# Patient Record
Sex: Female | Born: 1995 | Race: White | Hispanic: No | Marital: Single
Health system: Southern US, Community
[De-identification: ages and names within clinical notes are randomized; demographics above are authoritative.]

## PROBLEM LIST (undated history)

## (undated) DIAGNOSIS — S43101A Unspecified dislocation of right acromioclavicular joint, initial encounter: Secondary | ICD-10-CM

## (undated) DIAGNOSIS — J45909 Unspecified asthma, uncomplicated: Secondary | ICD-10-CM

## (undated) DIAGNOSIS — I639 Cerebral infarction, unspecified: Secondary | ICD-10-CM

## (undated) DIAGNOSIS — M797 Fibromyalgia: Secondary | ICD-10-CM

## (undated) DIAGNOSIS — D649 Anemia, unspecified: Secondary | ICD-10-CM

## (undated) DIAGNOSIS — Z8719 Personal history of other diseases of the digestive system: Secondary | ICD-10-CM

## (undated) DIAGNOSIS — N72 Inflammatory disease of cervix uteri: Secondary | ICD-10-CM

## (undated) HISTORY — PX: WISDOM TOOTH EXTRACTION: SHX21

## (undated) HISTORY — DX: Unspecified asthma, uncomplicated: J45.909

## (undated) HISTORY — PX: NO PAST SURGERIES: SHX2092

## (undated) HISTORY — DX: Unspecified dislocation of right acromioclavicular joint, initial encounter: S43.101A

## (undated) HISTORY — DX: Personal history of other diseases of the digestive system: Z87.19

## (undated) HISTORY — PX: LOOP RECORDER IMPLANT: SHX5954

## (undated) HISTORY — DX: Fibromyalgia: M79.7

## (undated) HISTORY — DX: Inflammatory disease of cervix uteri: N72

---
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
## 2017-04-09 ENCOUNTER — Telehealth: Payer: Self-pay

## 2017-04-09 NOTE — Telephone Encounter (Signed)
Received call from patient stating she was seen at [facility] this am with complaints of bleeding. Patient reports taking four pregnancy test at home and all came back positive for pregnancy. Patient reported they stated last night her test was negative for pregnancy at the hospital. Patient was advised to follow up with our office in two days. I have advised patient to report to MAU admission for a work up for bleeding in pregnancy and that they can draw a bhcg to determine if she is pregnant. Patient reported not getting any documentation on what labs were drawn last night. I am not able to see anything in care everywhere. Patient verbalizes understanding at this time.

## 2017-05-09 ENCOUNTER — Encounter: Payer: Self-pay | Admitting: Obstetrics and Gynecology

## 2017-05-09 DIAGNOSIS — Z3480 Encounter for supervision of other normal pregnancy, unspecified trimester: Secondary | ICD-10-CM | POA: Insufficient documentation

## 2017-05-20 ENCOUNTER — Encounter: Payer: Self-pay | Admitting: Obstetrics and Gynecology

## 2017-05-20 ENCOUNTER — Ambulatory Visit (INDEPENDENT_AMBULATORY_CARE_PROVIDER_SITE_OTHER): Payer: Medicaid Other | Admitting: *Deleted

## 2017-05-20 VITALS — BP 102/59 | Wt 119.0 lb

## 2017-05-20 DIAGNOSIS — Z3201 Encounter for pregnancy test, result positive: Secondary | ICD-10-CM

## 2017-05-20 NOTE — Progress Notes (Signed)
PRENATAL INTAKE SUMMARY  [name] presents today New OB Nurse Interview.  OB History    Gravida  3   Para  1   Term  1   Preterm  0   AB  1   Living  1     SAB  1   TAB  0   Ectopic  0   Multiple  0   Live Births  1          I have reviewed the patient's medical, obstetrical, social, and family histories, medications, and available lab results.  Pt states she had a miscarriage 04/09/17 and was [facility]. She has not had a cycle since then but has had a new positive Pregnancy Test.  She denies any bleeding or abnormal vaginal discharge.   SUBJECTIVE She has no unusual complaints  OBJECTIVE Initial Physical Exam (New OB)  GENERAL APPEARANCE: alert, well appearing, in no apparent distress, oriented to person, place and time   ASSESSMENT Positive Pregnancy - See US documentation below  PLAN New OB visit with MD in two weeks - Need to confirm viability before seen by MD Genetic/Infection screening complete.    DATING AND VIABILITY SONOGRAM   [name] is a 22 y.o. year old 713P1011 with LMP Patient's last menstrual period was 04/09/2017. which would correlate to  7637w6d weeks gestation.  She has irregular menstrual cycles.   She is here today for a confirmatory initial sonogram.    GESTATION: Unable to determine   FETAL ACTIVITY: Heart rate - Not seen The fetus is Not seen.   Gestational criteria: Estimated Date of Delivery: 01/14/18 by LMP now at 2137w6d  Previous Scans:0 WORKING EDD( LMP):  01/14/18    TECHNICIAN COMMENTS:  Gestational Sac and yolk sac seen on bedside US. No fetal pole seen. No FHR seen. Pt to rtn in 2 weeks for New OB visit with MD and viability scan.   Mandy Hutchinson 05/20/2017 3:06 PM

## 2017-05-21 NOTE — Progress Notes (Signed)
I have reviewed the chart and agree with nursing staff's documentation of this patient's encounter.  [place] Bingharlie Avari Nevares, MD 05/21/2017 11:27 AM

## 2017-06-02 ENCOUNTER — Ambulatory Visit (INDEPENDENT_AMBULATORY_CARE_PROVIDER_SITE_OTHER): Payer: Medicaid Other | Admitting: Obstetrics and Gynecology

## 2017-06-02 ENCOUNTER — Encounter: Payer: Self-pay | Admitting: Obstetrics and Gynecology

## 2017-06-02 ENCOUNTER — Other Ambulatory Visit (HOSPITAL_COMMUNITY)
Admission: RE | Admit: 2017-06-02 | Discharge: 2017-06-02 | Disposition: A | Discharge status: Home / Self Care | Payer: Medicaid Other | Source: Ambulatory Visit | Attending: Obstetrics and Gynecology | Admitting: Obstetrics and Gynecology

## 2017-06-02 VITALS — BP 103/68 | HR 61 | Wt 116.0 lb

## 2017-06-02 DIAGNOSIS — O219 Vomiting of pregnancy, unspecified: Secondary | ICD-10-CM | POA: Insufficient documentation

## 2017-06-02 DIAGNOSIS — Z3687 Encounter for antenatal screening for uncertain dates: Secondary | ICD-10-CM

## 2017-06-02 DIAGNOSIS — Z3480 Encounter for supervision of other normal pregnancy, unspecified trimester: Secondary | ICD-10-CM

## 2017-06-02 DIAGNOSIS — Z3481 Encounter for supervision of other normal pregnancy, first trimester: Secondary | ICD-10-CM | POA: Diagnosis not present

## 2017-06-02 MED ORDER — PROMETHAZINE HCL 25 MG/ML IJ SOLN
25.0000 mg | Freq: Once | INTRAMUSCULAR | Status: AC
  Administered 2017-06-02: 25 mg via INTRAMUSCULAR

## 2017-06-02 MED ORDER — DOXYLAMINE-PYRIDOXINE ER 20-20 MG PO TBCR: 1.0000 | EXTENDED_RELEASE_TABLET | Freq: Two times a day (BID) | ORAL | 3 refills | Status: DC

## 2017-06-02 NOTE — Progress Notes (Signed)
DATING AND VIABILITY SONOGRAM   [name] is a 22 y.o. year old 183P1011 with LMP Patient's last menstrual period was 04/09/2017. which would correlate to  6051w5d weeks gestation.  She has irregular menstrual cycles.   She is here today for a confirmatory initial sonogram.    GESTATION: SINGLETON     FETAL ACTIVITY:          Heart rate: 141bpm          The fetus is current.   GESTATIONAL AGE AND  BIOMETRICS:  Gestational criteria: Estimated Date of Delivery: 01/14/18 by LMP now at 8251w5d  Previous Scans:1  CROWN RUMP LENGTH   1.15cm       5312w2d           AVERAGE EGA(BY THIS SCAN):  7.2 weeks  WORKING EDD( LMP ):  01/14/18     TECHNICIAN COMMENTS:  SLIUP measuring 7.2 wks by CRL with FHR 141bpm  Leylah Tarnow 06/02/2017 4:24 PM

## 2017-06-02 NOTE — Progress Notes (Signed)
New OB Note  06/02/2017   Clinic: [facility]  Chief Complaint: NOB  Transfer of Care Patient: no  History of Present Illness: [name] is a 22 y.o. G3P1011 @ 7/5 weeks (EDC 11/27, based on Patient's last menstrual period was 04/09/2017.=7wk u/s).  Preg complicated by has Asthma; Supervision of other normal pregnancy, antepartum; and Nausea and vomiting of pregnancy, antepartum on their problem list.   Any events prior to today's visit: no Her periods were: qmonth, regular She was using no method when she conceived.  She has Positive signs or symptoms of nausea/vomiting of pregnancy. She has Negative signs or symptoms of miscarriage or preterm labor On any medications around the time she conceived/early pregnancy: No   ROS: A 12-point review of systems was performed and negative, except as stated in the above HPI.  OBGYN History: As per HPI. OB History  Gravida Para Term Preterm AB Living  3 1 1  0 1 1  SAB TAB Ectopic Multiple Live Births  1 0 0 0 1    # Outcome Date GA Lbr Len/2nd Weight Sex Delivery Anes PTL Lv  3 Current           2 SAB 04/09/17     SAB     1 Term 11/19/12 6543w4d 21:35 / 01:28 7 lb 10.9 oz (3.485 kg) M Vag-Spont EPI  LIV     Birth Comments: WNL    Any issues with any prior pregnancies: no Prior children are healthy, doing well, and without any problems or issues: yes History of pap smears: No  Past Medical History: Past Medical History:  Diagnosis Date  . Asthma    Rare uses rescue inhaler  . Cervicitis 2014  . Medical history non-contributory     Past Surgical History: Past Surgical History:  Procedure Laterality Date  . WISDOM TOOTH EXTRACTION      Family History:  Family History  Problem Relation Age of Onset  . Lupus Mother   . Cerebral palsy Brother        preterm delivery/mother has lupus    She denies any history of mental retardation, birth defects or genetic disorders in her or the FOB's  history  Social History:  Social History   Socioeconomic History  . Marital status: Single    Spouse name: Not on file  . Number of children: Not on file  . Years of education: Not on file  . Highest education level: Not on file  Occupational History  . Not on file  Social Needs  . Financial resource strain: Not on file  . Food insecurity:    Worry: Not on file    Inability: Not on file  . Transportation needs:    Medical: Not on file    Non-medical: Not on file  Tobacco Use  . Smoking status: Never Smoker  . Smokeless tobacco: Never Used  Substance and Sexual Activity  . Alcohol use: No  . Drug use: No  . Sexual activity: Yes    Partners: Male    Birth control/protection: None  Lifestyle  . Physical activity:    Days per week: Not on file    Minutes per session: Not on file  . Stress: Not on file  Relationships  . Social connections:    Talks on phone: Not on file    Gets together: Not on file    Attends religious service: Not on file    Active member of club or organization: Not  on file    Attends meetings of clubs or organizations: Not on file    Relationship status: Not on file  . Intimate partner violence:    Fear of current or ex partner: Not on file    Emotionally abused: Not on file    Physically abused: Not on file    Forced sexual activity: Not on file  Other Topics Concern  . Not on file  Social History Narrative  . Not on file     Allergy: Allergies  Allergen Reactions  . Amoxicillin Rash    Health Maintenance:  Mammogram Up to Date: not applicable  Current Outpatient Medications: PNV  Physical Exam:   BP 103/68   Pulse 61   Wt 116 lb (52.6 kg)   LMP 04/09/2017   BMI 22.65 kg/m  Body mass index is 22.65 kg/m. Contractions: Not present Vag. Bleeding: None. Fundal height: not applicable FHTs: 140s  General appearance: Well nourished, well developed female in no acute distress.  Neck:  Supple, normal appearance, and no  thyromegaly  Cardiovascular: S1, S2 normal, no murmur, rub or gallop, regular rate and rhythm Respiratory:  Clear to auscultation bilateral. Normal respiratory effort Abdomen: positive bowel sounds and no masses, hernias; diffusely non tender to palpation, non distended Breasts: breasts appear normal, no suspicious masses, no skin or nipple changes or axillary nodes, and normal palpation. +b/l nipple piercings. Neuro/Psych:  Normal mood and affect.  Skin:  Warm and dry.  Lymphatic:  No inguinal lymphadenopathy.   Pelvic exam: is not limited by body habitus EGBUS: within normal limits, Vagina: within normal limits and with no blood in the vault, Cervix: normal appearing cervix without discharge or lesions, closed/long/high, Uterus:  enlarged, c/w 8 week size, and Adnexa:  normal adnexa and no mass, fullness, tenderness  Laboratory: None  Imaging:  Bedside u/s with SLIUP with normal FHR and c/w LMP based on CRL  Assessment: pt doing well  Plan: 1. Supervision of other normal pregnancy, antepartum Routine care. Desires genetics and can do cffdna nv. Pt to try and void for ucx. Pt had bad n/v with last pregnancy that worked with zofran. Has never tried diclegis. bonjesta sent in and IM phenergan shot given in clinic. Pt told to let us know by end of week if bonjesta doesn't seem to be working and can try PO phenergan or zofran. D/w her re: potential issues with zofran.  - Obstetric Panel, Including HIV - Cytology - PAP - Urine Culture - Hemoglobinopathy evaluation - SMN1 Copy Number Analysis - [place] bedside; Future - Hepatitis C Antibody  Problem list reviewed and updated.  Follow up in 3 weeks.   >50% of 25 min visit spent on counseling and coordination of care.     Cornelia Copa MD Attending Center for [facility])

## 2017-06-03 ENCOUNTER — Encounter: Payer: Self-pay | Admitting: Obstetrics and Gynecology

## 2017-06-03 LAB — OBSTETRIC PANEL, INCLUDING HIV
Antibody Screen: NEGATIVE
Basophils Absolute: 0 10*3/uL (ref 0.0–0.2)
Basos: 0 %
EOS (ABSOLUTE): 0 10*3/uL (ref 0.0–0.4)
Eos: 0 %
HEP B S AG: NEGATIVE
HIV SCREEN 4TH GENERATION: NONREACTIVE
Hematocrit: 34.5 % (ref 34.0–46.6)
Hemoglobin: 12.2 g/dL (ref 11.1–15.9)
IMMATURE GRANULOCYTES: 0 %
Immature Grans (Abs): 0 10*3/uL (ref 0.0–0.1)
Lymphocytes Absolute: 2.3 10*3/uL (ref 0.7–3.1)
Lymphs: 24 %
MCH: 31.4 pg (ref 26.6–33.0)
MCHC: 35.4 g/dL (ref 31.5–35.7)
MCV: 89 fL (ref 79–97)
MONOS ABS: 0.8 10*3/uL (ref 0.1–0.9)
Monocytes: 8 %
NEUTROS ABS: 6.4 10*3/uL (ref 1.4–7.0)
NEUTROS PCT: 68 %
PLATELETS: 250 10*3/uL (ref 150–379)
RBC: 3.88 x10E6/uL (ref 3.77–5.28)
RDW: 12.5 % (ref 12.3–15.4)
RPR: NONREACTIVE
Rh Factor: POSITIVE
Rubella Antibodies, IGG: <0.9 index — ABNORMAL LOW (ref >0.99)
WBC: 9.5 10*3/uL (ref 3.4–10.8)

## 2017-06-03 LAB — URINE CULTURE

## 2017-06-03 LAB — HEPATITIS C ANTIBODY

## 2017-06-04 LAB — CYTOLOGY - PAP
Chlamydia: NEGATIVE
Diagnosis: NEGATIVE
NEISSERIA GONORRHEA: NEGATIVE
Trichomonas: NEGATIVE

## 2017-06-10 LAB — SMN1 COPY NUMBER ANALYSIS (SMA CARRIER SCREENING)

## 2017-06-10 LAB — HEMOGLOBINOPATHY EVALUATION
HGB A: 97.8 % (ref 96.4–98.8)
HGB C: 0 %
HGB S: 0 %
HGB VARIANT: 0 %
Hemoglobin A2 Quantitation: 2.2 % (ref 1.8–3.2)
Hemoglobin F Quantitation: 0 % (ref 0.0–2.0)

## 2017-06-23 ENCOUNTER — Ambulatory Visit (INDEPENDENT_AMBULATORY_CARE_PROVIDER_SITE_OTHER): Payer: Medicaid Other | Admitting: Obstetrics & Gynecology

## 2017-06-23 VITALS — BP 95/69 | HR 61 | Wt 118.6 lb

## 2017-06-23 DIAGNOSIS — O021 Missed abortion: Secondary | ICD-10-CM | POA: Diagnosis not present

## 2017-06-23 NOTE — Progress Notes (Signed)
History:  22 y.o. G3P1011 here today for routine OB. LMP unk Per pt: she recently had a SAB in Feb. Never had a cycle in [facility]. This pregnancy, she reports that she prev has N/V which is now resolved after she was started on meds.  She denies bleeding or pelvic pain.    The following portions of the patient's history were reviewed and updated as appropriate: allergies, current medications, past family history, past medical history, past social history, past surgical history and problem list.  Review of Systems:  Pertinent items are noted in HPI.    Objective:  Physical Exam Blood pressure 95/69, pulse 61, weight 118 lb 9.6 oz (53.8 kg), last menstrual period 04/09/2017, currently breastfeeding.  CONSTITUTIONAL: Well-developed, well-nourished female in no acute distress.  HENT:  Normocephalic, atraumatic EYES: Conjunctivae and EOM are normal. No scleral icterus.  NECK: Normal range of motion SKIN: Skin is warm and dry. No rash noted. Not diaphoretic.No pallor. NEUROLGIC: Alert and oriented to person, place, and time. Normal coordination.   TVUS- 8 week missed ab. Fetal pole with no FHR.    Labs and Imaging No results found.  Assessment & Plan:  Missed AB @ ~ 8 weeks. No bleeding noted. Pregnancy sx resolved. D/w pt dx and she was given tx options including cytotec vs D&E. Pt prefers a D&E. She would like to have it Friday (in 4 days) when her partner is off work and can accompany her.   schedule D&E NPO after MN prior to the procedure.   Felishia Wartman L. Harraway-Smith, M.D., [facility]

## 2017-06-23 NOTE — Patient Instructions (Signed)

## 2017-06-24 ENCOUNTER — Encounter: Payer: Self-pay | Admitting: Obstetrics & Gynecology

## 2017-06-24 ENCOUNTER — Encounter (HOSPITAL_COMMUNITY): Payer: Self-pay

## 2017-06-24 DIAGNOSIS — O021 Missed abortion: Secondary | ICD-10-CM | POA: Diagnosis present

## 2017-06-25 ENCOUNTER — Other Ambulatory Visit: Payer: Self-pay

## 2017-06-25 ENCOUNTER — Encounter (HOSPITAL_COMMUNITY): Payer: Self-pay | Admitting: *Deleted

## 2017-06-25 NOTE — H&P (Signed)
Preoperative History and Physical  Morgan Beltran is a 22 y.o. [phone] here for management of intrauterine fetal demise at [redacted] weeks gestation.   No significant preoperative concerns.  Proposed surgery: Dilation and Evacuation  Past Medical History:  Diagnosis Date  . Asthma    childhood asthma, no inaher, no prob as adult  . Cervicitis 2014  . SVD (spontaneous vaginal delivery)    x 1   Past Surgical History:  Procedure Laterality Date  . WISDOM TOOTH EXTRACTION    . WISDOM TOOTH EXTRACTION  2012   OB History  Gravida Para Term Preterm AB Living  0 1 1  SAB TAB Ectopic Multiple Live Births  1 0 0 0 1    # Outcome Date GA Lbr Len/2nd Weight Sex Delivery Anes PTL Lv  4 Current           3 SAB 04/09/17     SAB     2 Term 11/19/12 [redacted]w[redacted]d 21:35 / 01:28 7 lb 10.9 oz (3.485 kg) M Vag-Spont EPI  LIV     Birth Comments: WNL  1 Gravida           Patient denies any other pertinent gynecologic issues.   No current facility-administered medications on file prior to encounter.    Current Outpatient Medications on File Prior to Encounter  Medication Sig Dispense Refill  . acetaminophen (TYLENOL) 500 MG tablet Take 500 mg by mouth every 8 (eight) hours as needed.    . doxylamine, Sleep, (UNISOM) 25 MG tablet Take 12.5 mg by mouth at bedtime.    . Doxylamine-Pyridoxine ER (BONJESTA) 20-20 MG TBCR Take 1 tablet by mouth 2 (two) times daily. 60 tablet 3  . Prenatal Vit-Fe Fumarate-FA (PRENATAL MULTIVITAMIN) TABS tablet Take 1 tablet by mouth daily.     . Pyridoxine HCl (B-6) 100 MG TABS Take 50 mg by mouth daily.     Allergies  Allergen Reactions  . Penicillins Hives    Has patient had a PCN reaction causing immediate rash, facial/tongue/throat swelling, SOB or lightheadedness with hypotension: yes Has patient had a PCN reaction causing severe rash involving mucus membranes or skin necrosis: no Has patient had a PCN reaction that required hospitalization: no Has patient had a PCN  reaction occurring within the last 10 years: no If all of the above answers are "NO", then may proceed with Cephalosporin use.   Marland Kitchen Amoxicillin Rash    Social History:   reports that she has never smoked. She has never used smokeless tobacco. She reports that she does not drink alcohol or use drugs.  Family History  Problem Relation Age of Onset  . Lupus Mother   . Cerebral palsy Brother        preterm delivery/mother has lupus    Review of Systems: Pertinent items noted in HPI and remainder of comprehensive ROS otherwise negative.  PHYSICAL EXAM: Blood pressure 100/63, pulse 67, temperature 98.7 F (37.1 C), temperature source Oral, resp. rate 16, height 5' (1.524 m), weight 118 lb (53.5 kg), last menstrual period 04/09/2017, SpO2 100 %, unknown if currently breastfeeding. CONSTITUTIONAL: Well-developed, well-nourished female in no acute distress.  HENT:  Normocephalic, atraumatic, External right and left ear normal. Oropharynx is clear and moist EYES: Conjunctivae and EOM are normal. Pupils are equal, round, and reactive to light. No scleral icterus.  NECK: Normal range of motion, supple, no masses SKIN: Skin is warm and dry. No rash noted. Not diaphoretic. No erythema. No pallor.  NEUROLOGIC: Alert and oriented to person, place, and time. Normal reflexes, muscle tone coordination. No cranial nerve deficit noted. PSYCHIATRIC: Normal mood and affect. Normal behavior. Normal judgment and thought content. CARDIOVASCULAR: Normal heart rate noted, regular rhythm RESPIRATORY: Effort and breath sounds normal, no problems with respiration noted ABDOMEN: Soft, nontender, nondistended. PELVIC: Deferred MUSCULOSKELETAL: Normal range of motion. No edema and no tenderness. 2+ distal pulses.  Labs: Results for orders placed or performed during the hospital encounter of 06/27/17 (from the past 336 hour(s))  CBC   Collection Time: 06/27/17  9:15 AM  Result Value Ref Range   WBC 6.0 4.0 - 10.5  K/uL   RBC 3.84 (L) 3.87 - 5.11 MIL/uL   Hemoglobin 12.0 12.0 - 15.0 g/dL   HCT 40.9 (L) 81.1 - 91.4 %   MCV 93.2 78.0 - 100.0 fL   MCH 31.3 26.0 - 34.0 pg   MCHC 33.5 30.0 - 36.0 g/dL   RDW 78.2 95.6 - 21.3 %   Platelets 219 150 - 400 K/uL   AB+ blood type  Imaging Studies: 06/23/2017 Transvaginal Office Ultrasound:  8 week missed abortion Fetal pole with no FHR.  Assessment: Active Problems:   Intrauterine fetal demise at [redacted] weeks gestation   Plan: Patient will undergo surgical management with Dilation and Evacuation.  The risks of surgery were discussed in detail with the patient including but not limited to: bleeding, infection, injury to surrounding organs, need for additional procedures, possibility of intrauterine scarring which may impair future fertility, risk of retained products which may require further management and other postoperative/anesthesia complications were explained to patient.  Likelihood of success of complete evacuation of the uterus was discussed with the patient.  Written informed consent was obtained.  Patient has been NPO since last ngiht  she will remain NPO for procedure. Anesthesia and OR aware.  Preoperative prophylactic Doxycycline  IV and SCDs ordered on call to the OR.  To OR when ready.     Jaynie Collins, MD, FACOG Obstetrician & Gynecologist, [facility] for [employer], [facility]

## 2017-06-27 ENCOUNTER — Other Ambulatory Visit: Payer: Self-pay

## 2017-06-27 ENCOUNTER — Encounter (HOSPITAL_COMMUNITY)
Admission: RE | Disposition: A | Discharge status: Home / Self Care | Payer: Self-pay | Source: Ambulatory Visit | Attending: Obstetrics & Gynecology

## 2017-06-27 ENCOUNTER — Encounter (HOSPITAL_COMMUNITY): Payer: Self-pay | Admitting: *Deleted

## 2017-06-27 ENCOUNTER — Ambulatory Visit (HOSPITAL_COMMUNITY): Payer: Medicaid Other | Admitting: Anesthesiology

## 2017-06-27 ENCOUNTER — Ambulatory Visit (HOSPITAL_COMMUNITY)
Admission: RE | Admit: 2017-06-27 | Discharge: 2017-06-27 | Disposition: A | Discharge status: Home / Self Care | Payer: Medicaid Other | Source: Ambulatory Visit | Attending: Obstetrics & Gynecology | Admitting: Obstetrics & Gynecology

## 2017-06-27 DIAGNOSIS — O021 Missed abortion: Secondary | ICD-10-CM | POA: Diagnosis present

## 2017-06-27 HISTORY — PX: DILATION AND EVACUATION: SHX1459

## 2017-06-27 LAB — CBC
HEMATOCRIT: 35.8 % — AB (ref 36.0–46.0)
Hemoglobin: 12 g/dL (ref 12.0–15.0)
MCH: 31.3 pg (ref 26.0–34.0)
MCHC: 33.5 g/dL (ref 30.0–36.0)
MCV: 93.2 fL (ref 78.0–100.0)
Platelets: 219 10*3/uL (ref 150–400)
RBC: 3.84 MIL/uL — AB (ref 3.87–5.11)
RDW: 12.8 % (ref 11.5–15.5)
WBC: 6 10*3/uL (ref 4.0–10.5)

## 2017-06-27 SURGERY — DILATION AND EVACUATION, UTERUS
Anesthesia: Monitor Anesthesia Care

## 2017-06-27 MED ORDER — METOCLOPRAMIDE HCL 5 MG/ML IJ SOLN: 10.0000 mg | Freq: Once | INTRAMUSCULAR | Status: DC | PRN

## 2017-06-27 MED ORDER — PROPOFOL 10 MG/ML IV BOLUS
INTRAVENOUS | Status: DC | PRN
  Administered 2017-06-27: 50 mg via INTRAVENOUS
  Administered 2017-06-27 (×2): 20 mg via INTRAVENOUS
  Administered 2017-06-27: 50 mg via INTRAVENOUS
  Administered 2017-06-27 (×4): 20 mg via INTRAVENOUS

## 2017-06-27 MED ORDER — SCOPOLAMINE 1 MG/3DAYS TD PT72
MEDICATED_PATCH | TRANSDERMAL | Status: AC
  Filled 2017-06-27: qty 1

## 2017-06-27 MED ORDER — KETOROLAC TROMETHAMINE 30 MG/ML IJ SOLN
INTRAMUSCULAR | Status: DC | PRN
  Administered 2017-06-27: 30 mg via INTRAVENOUS
  Administered 2017-06-27: 30 mg via INTRAMUSCULAR

## 2017-06-27 MED ORDER — SOD CITRATE-CITRIC ACID 500-334 MG/5ML PO SOLN
30.0000 mL | ORAL | Status: AC
  Administered 2017-06-27: 30 mL via ORAL

## 2017-06-27 MED ORDER — TRAMADOL HCL 50 MG PO TABS: 50.0000 mg | ORAL_TABLET | Freq: Four times a day (QID) | ORAL | 0 refills | Status: DC | PRN

## 2017-06-27 MED ORDER — ONDANSETRON HCL 4 MG/2ML IJ SOLN
INTRAMUSCULAR | Status: DC | PRN
  Administered 2017-06-27: 4 mg via INTRAVENOUS

## 2017-06-27 MED ORDER — LIDOCAINE HCL (CARDIAC) PF 50 MG/5ML IV SOSY
PREFILLED_SYRINGE | INTRAVENOUS | Status: DC | PRN
  Administered 2017-06-27: 60 mg via INTRAVENOUS

## 2017-06-27 MED ORDER — SOD CITRATE-CITRIC ACID 500-334 MG/5ML PO SOLN
ORAL | Status: AC
  Filled 2017-06-27: qty 15

## 2017-06-27 MED ORDER — FENTANYL CITRATE (PF) 100 MCG/2ML IJ SOLN
INTRAMUSCULAR | Status: AC
  Filled 2017-06-27: qty 2

## 2017-06-27 MED ORDER — MIDAZOLAM HCL 2 MG/2ML IJ SOLN
INTRAMUSCULAR | Status: AC
Start: 2017-06-27 — End: ?
  Filled 2017-06-27: qty 2

## 2017-06-27 MED ORDER — MEPERIDINE HCL 25 MG/ML IJ SOLN: 6.2500 mg | INTRAMUSCULAR | Status: DC | PRN

## 2017-06-27 MED ORDER — PROPOFOL 10 MG/ML IV BOLUS
INTRAVENOUS | Status: AC
  Filled 2017-06-27: qty 40

## 2017-06-27 MED ORDER — SCOPOLAMINE 1 MG/3DAYS TD PT72
1.0000 | MEDICATED_PATCH | Freq: Once | TRANSDERMAL | Status: DC
  Administered 2017-06-27: 1.5 mg via TRANSDERMAL

## 2017-06-27 MED ORDER — LIDOCAINE HCL (CARDIAC) PF 100 MG/5ML IV SOSY
PREFILLED_SYRINGE | INTRAVENOUS | Status: AC
  Filled 2017-06-27: qty 5

## 2017-06-27 MED ORDER — IBUPROFEN 600 MG PO TABS: 600.0000 mg | ORAL_TABLET | Freq: Four times a day (QID) | ORAL | 2 refills | Status: DC | PRN

## 2017-06-27 MED ORDER — BUPIVACAINE HCL (PF) 0.5 % IJ SOLN
INTRAMUSCULAR | Status: AC
  Filled 2017-06-27: qty 30

## 2017-06-27 MED ORDER — MIDAZOLAM HCL 5 MG/5ML IJ SOLN
INTRAMUSCULAR | Status: DC | PRN
  Administered 2017-06-27: 2 mg via INTRAVENOUS

## 2017-06-27 MED ORDER — PROPOFOL 500 MG/50ML IV EMUL: INTRAVENOUS | Status: DC | PRN

## 2017-06-27 MED ORDER — DOCUSATE SODIUM 100 MG PO CAPS: 100.0000 mg | ORAL_CAPSULE | Freq: Two times a day (BID) | ORAL | 2 refills | Status: DC | PRN

## 2017-06-27 MED ORDER — FENTANYL CITRATE (PF) 100 MCG/2ML IJ SOLN
INTRAMUSCULAR | Status: DC | PRN
  Administered 2017-06-27 (×2): 50 ug via INTRAVENOUS

## 2017-06-27 MED ORDER — DOXYCYCLINE HYCLATE 100 MG IV SOLR
200.0000 mg | INTRAVENOUS | Status: AC
  Administered 2017-06-27: 200 mg via INTRAVENOUS
  Filled 2017-06-27: qty 200

## 2017-06-27 MED ORDER — LACTATED RINGERS IV SOLN
INTRAVENOUS | Status: DC
  Administered 2017-06-27: 12:00:00 via INTRAVENOUS

## 2017-06-27 MED ORDER — LACTATED RINGERS IV SOLN: INTRAVENOUS | Status: DC

## 2017-06-27 MED ORDER — BUPIVACAINE HCL 0.5 % IJ SOLN
INTRAMUSCULAR | Status: DC | PRN
  Administered 2017-06-27: 30 mL

## 2017-06-27 MED ORDER — LACTATED RINGERS IV SOLN
INTRAVENOUS | Status: DC
  Administered 2017-06-27: 125 mL/h via INTRAVENOUS

## 2017-06-27 MED ORDER — FENTANYL CITRATE (PF) 100 MCG/2ML IJ SOLN: 25.0000 ug | INTRAMUSCULAR | Status: DC | PRN

## 2017-06-27 SURGICAL SUPPLY — 18 items
CATH ROBINSON RED A/P 16FR (CATHETERS) ×3 IMPLANT
DECANTER SPIKE VIAL GLASS SM (MISCELLANEOUS) ×3 IMPLANT
GLOVE BIOGEL PI IND STRL 7.0 (GLOVE) ×1 IMPLANT
GLOVE BIOGEL PI INDICATOR 7.0 (GLOVE) ×2
GLOVE ECLIPSE 7.0 STRL STRAW (GLOVE) ×3 IMPLANT
GOWN STRL REUS W/TWL LRG LVL3 (GOWN DISPOSABLE) ×6 IMPLANT
KIT BERKELEY 1ST TRIMESTER 3/8 (MISCELLANEOUS) ×3 IMPLANT
NS IRRIG 1000ML POUR BTL (IV SOLUTION) ×3 IMPLANT
PACK VAGINAL MINOR WOMEN LF (CUSTOM PROCEDURE TRAY) ×3 IMPLANT
PAD OB MATERNITY 4.3X12.25 (PERSONAL CARE ITEMS) ×3 IMPLANT
PAD PREP 24X48 CUFFED NSTRL (MISCELLANEOUS) ×3 IMPLANT
SET BERKELEY SUCTION TUBING (SUCTIONS) ×3 IMPLANT
TOWEL OR 17X24 6PK STRL BLUE (TOWEL DISPOSABLE) ×6 IMPLANT
VACURETTE 10 RIGID CVD (CANNULA) IMPLANT
VACURETTE 6 ASPIR F TIP BERK (CANNULA) IMPLANT
VACURETTE 7MM CVD STRL WRAP (CANNULA) IMPLANT
VACURETTE 8 RIGID CVD (CANNULA) ×3 IMPLANT
VACURETTE 9 RIGID CVD (CANNULA) IMPLANT

## 2017-06-27 NOTE — Anesthesia Procedure Notes (Signed)
Date/Time: 06/27/2017 10:12 AM Performed by: Franco Nones, CRNA Pre-anesthesia Checklist: Patient identified, Suction available, Patient being monitored and Timeout performed Patient Re-evaluated:Patient Re-evaluated prior to induction Oxygen Delivery Method: Circle system utilized Preoxygenation: Pre-oxygenation with 100% oxygen

## 2017-06-27 NOTE — Op Note (Signed)
Morgan Beltran PROCEDURE DATE: 06/27/2017  PREOPERATIVE DIAGNOSIS: Intrauterine fetal demise at [redacted] weeks gestation POSTOPERATIVE DIAGNOSIS: The same PROCEDURE: Dilation and Evacuation SURGEON:  Dr. Jaynie Beltran  INDICATIONS: 22 y.o. [id number] with intrauterine fetal demise at [redacted] weeks gestation, needing surgical completion.  Risks of surgery were discussed with the patient including but not limited to: bleeding which may require transfusion; infection which may require antibiotics; injury to uterus or surrounding organs; need for additional procedures including laparotomy or laparoscopy; possibility of intrauterine scarring which may impair future fertility; and other postoperative/anesthesia complications. Written informed consent was obtained.    FINDINGS:  A 8 week size uterus, moderate amounts of products of conception, specimen sent to pathology.  ANESTHESIA:    Monitored intravenous sedation, paracervical block. ESTIMATED BLOOD LOSS:  10 ml. SPECIMENS:  Products of conception sent to pathology COMPLICATIONS:  None immediate.  PROCEDURE DETAILS:  The patient received intravenous Doxycycline while in the preoperative area.  She was then taken to the operating room where monitored intravenous sedation was administered and was found to be adequate.  After an adequate timeout was performed, she was placed in the dorsal lithotomy position and examined; then prepped and draped in the sterile manner.   Her bladder was catheterized for an unmeasured amount of clear, yellow urine. A vaginal speculum was then placed in the patient's vagina and a single tooth tenaculum was applied to the anterior lip of the cervix.  A paracervical block using 30 ml of 0.5% Marcaine was administered. The cervix was gently dilated to accommodate a 8 mm suction curette that was gently advanced to the uterine fundus.  The suction device was then activated and curette slowly rotated to clear the uterus of products of  conception.  A sharp curettage was then performed to confirm complete emptying of the uterus. There was minimal bleeding noted and the tenaculum removed with good hemostasis noted.   All instruments were removed from the patient's vagina.  Sponge and instrument counts were correct times two  The patient tolerated the procedure well and was taken to the recovery area awake, and in stable condition.  The patient will be discharged to home as per PACU criteria.  Routine postoperative instructions given.  She was prescribed Tramadol, Ibuprofen and Colace.  She will follow up in the clinic in 2-3 weeks for postoperative evaluation.    Morgan Collins, MD, FACOG Obstetrician & Gynecologist, [facility] for [employer], [facility]

## 2017-06-27 NOTE — Anesthesia Preprocedure Evaluation (Signed)
Anesthesia Evaluation  Patient identified by MRN, date of birth, ID band Patient awake    Reviewed: Allergy & Precautions, NPO status , Patient's Chart, lab work & pertinent test results  Airway Mallampati: II  TM Distance: >3 FB Neck ROM: Full    Dental no notable dental hx.    Pulmonary neg pulmonary ROS,    Pulmonary exam normal breath sounds clear to auscultation       Cardiovascular negative cardio ROS Normal cardiovascular exam Rhythm:Regular Rate:Normal     Neuro/Psych negative neurological ROS  negative psych ROS   GI/Hepatic negative GI ROS, Neg liver ROS,   Endo/Other  negative endocrine ROS  Renal/GU negative Renal ROS  negative genitourinary   Musculoskeletal negative musculoskeletal ROS (+)   Abdominal   Peds negative pediatric ROS (+)  Hematology negative hematology ROS (+)   Anesthesia Other Findings   Reproductive/Obstetrics negative OB ROS                             Anesthesia Physical Anesthesia Plan  ASA: II  Anesthesia Plan: MAC   Post-op Pain Management:    Induction:   PONV Risk Score and Plan: 1 and Ondansetron  Airway Management Planned: Natural Airway  Additional Equipment:   Intra-op Plan:   Post-operative Plan:   Informed Consent: I have reviewed the patients History and Physical, chart, labs and discussed the procedure including the risks, benefits and alternatives for the proposed anesthesia with the patient or authorized representative who has indicated his/her understanding and acceptance.   Dental advisory given  Plan Discussed with: CRNA  Anesthesia Plan Comments:         Anesthesia Quick Evaluation

## 2017-06-27 NOTE — Anesthesia Postprocedure Evaluation (Signed)
Anesthesia Post Note  Patient: GHINA BITTINGER  Procedure(s) Performed: DILATATION AND EVACUATION (N/A )     Patient location during evaluation: PACU Anesthesia Type: MAC Level of consciousness: awake and alert Pain management: pain level controlled Vital Signs Assessment: post-procedure vital signs reviewed and stable Respiratory status: spontaneous breathing, nonlabored ventilation, respiratory function stable and patient connected to nasal cannula oxygen Cardiovascular status: stable and blood pressure returned to baseline Postop Assessment: no apparent nausea or vomiting Anesthetic complications: no    Last Vitals:  Vitals:   06/27/17 0926 06/27/17 1050  BP: 100/63 92/65  Pulse: 67 (!) 48  Resp: 16 10  Temp: 37.1 C 36.6 C  SpO2: 100% 98%    Last Pain:  Vitals:   06/27/17 0926  TempSrc: Oral   Pain Goal: Patients Stated Pain Goal: 4 (06/27/17 0926)               Montez Hageman

## 2017-06-27 NOTE — Transfer of Care (Signed)
Immediate Anesthesia Transfer of Care Note  Patient: Morgan Beltran  Procedure(s) Performed: DILATATION AND EVACUATION (N/A )  Patient Location: PACU  Anesthesia Type:MAC  Level of Consciousness: sedated and patient cooperative  Airway & Oxygen Therapy: Patient Spontanous Breathing and Patient connected to nasal cannula oxygen  Post-op Assessment: Report given to RN and Post -op Vital signs reviewed and stable  Post vital signs: Reviewed and stable  Last Vitals:  Vitals Value Taken Time  BP    Temp    Pulse 48 06/27/2017 10:49 AM  Resp    SpO2 98 % 06/27/2017 10:49 AM  Vitals shown include unvalidated device data.  Last Pain:  Vitals:   06/27/17 0926  TempSrc: Oral      Patients Stated Pain Goal: 4 (26/33/35 4562)  Complications: No apparent anesthesia complications

## 2017-06-27 NOTE — Discharge Instructions (Signed)
DISCHARGE INSTRUCTIONS: D&C / D&E °The following instructions have been prepared to help you care for yourself upon your return home. °  °Personal hygiene: °• Use sanitary pads for vaginal drainage, not tampons. °• Shower the day after your procedure. °• NO tub baths, pools or Jacuzzis for 2-3 weeks. °• Wipe front to back after using the bathroom. ° °Activity and limitations: °• Do NOT drive or operate any equipment for 24 hours. The effects of anesthesia are still present and drowsiness may result. °• Do NOT rest in bed all day. °• Walking is encouraged. °• Walk up and down stairs slowly. °• You may resume your normal activity in one to two days or as indicated by your physician. ° °Sexual activity: NO intercourse for at least 2 weeks after the procedure, or as indicated by your physician. ° °Diet: Eat a light meal as desired this evening. You may resume your usual diet tomorrow. ° °Return to work: You may resume your work activities in one to two days or as indicated by your doctor. ° °What to expect after your surgery: Expect to have vaginal bleeding/discharge for 2-3 days and spotting for up to 10 days. It is not unusual to have soreness for up to 1-2 weeks. You may have a slight burning sensation when you urinate for the first day. Mild cramps may continue for a couple of days. You may have a regular period in 2-6 weeks. ° °Call your doctor for any of the following: °• Excessive vaginal bleeding, saturating and changing one pad every hour. °• Inability to urinate 6 hours after discharge from hospital. °• Pain not relieved by pain medication. °• Fever of 100.4° F or greater. °• Unusual vaginal discharge or odor. ° ° Call for an appointment:  ° ° °Patient’s signature: ______________________ ° °Nurse’s signature ________________________ ° °Support person's signature_______________________ ° ° °Dilation and Curettage or Vacuum Curettage, Care After °These instructions give you information about caring for yourself  after your procedure. Your doctor may also give you more specific instructions. Call your doctor if you have any problems or questions after your procedure. °Follow these instructions at home: °Activity °· Do not drive or use heavy machinery while taking prescription pain medicine. °· For 24 hours after your procedure, avoid driving. °· Take short walks often, followed by rest periods. Ask your doctor what activities are safe for you. After one or two days, you may be able to return to your normal activities. °· Do not lift anything that is heavier than 10 lb (4.5 kg) until your doctor approves. °· For at least 2 weeks, or as long as told by your doctor: °¨ Do not douche. °¨ Do not use tampons. °¨ Do not have sex. °General instructions °· Take over-the-counter and prescription medicines only as told by your doctor. This is very important if you take blood thinning medicine. °· Do not take baths, swim, or use a hot tub until your doctor approves. Take showers instead of baths. °· Wear compression stockings as told by your doctor. °· It is up to you to get the results of your procedure. Ask your doctor when your results will be ready. °· Keep all follow-up visits as told by your doctor. This is important. °Contact a doctor if: °· You have very bad cramps that get worse or do not get better with medicine. °· You have very bad pain in your belly (abdomen). °· You cannot drink fluids without throwing up (vomiting). °· You get pain in a   different part of the area between your belly and thighs (pelvis). °· You have bad-smelling discharge from your vagina. °· You have a rash. °Get help right away if: °· You are bleeding a lot from your vagina. A lot of bleeding means soaking more than one sanitary pad in an hour, for 2 hours in a row. °· You have clumps of blood (blood clots) coming from your vagina. °· You have a fever or chills. °· Your belly feels very tender or hard. °· You have chest pain. °· You have trouble  breathing. °· You cough up blood. °· You feel dizzy. °· You feel light-headed. °· You pass out (faint). °· You have pain in your neck or shoulder area. °Summary °· Take short walks often, followed by rest periods. Ask your doctor what activities are safe for you. After one or two days, you may be able to return to your normal activities. °· Do not lift anything that is heavier than 10 lb (4.5 kg) until your doctor approves. °· Do not take baths, swim, or use a hot tub until your doctor approves. Take showers instead of baths. °· Contact your doctor if you have any symptoms of infection, like bad-smelling discharge from your vagina. °This information is not intended to replace advice given to you by your health care provider. Make sure you discuss any questions you have with your health care provider. °Document Released: 11/14/2007 Document Revised: 10/23/2015 Document Reviewed: 10/23/2015 °Elsevier Interactive Patient Education © 2017 Elsevier Inc. ° ° °

## 2017-06-28 ENCOUNTER — Encounter (HOSPITAL_COMMUNITY): Payer: Self-pay | Admitting: Obstetrics & Gynecology

## 2017-06-30 ENCOUNTER — Telehealth: Payer: Self-pay | Admitting: Radiology

## 2017-06-30 NOTE — Telephone Encounter (Signed)
Left message stating patients postop appointment with Dr Macon Large on 07/15/17 @ 10:00. Explained is this would be a conflict then to call [facility] to reschedule. Provided call back number

## 2017-07-15 ENCOUNTER — Encounter: Payer: Medicaid Other | Admitting: Obstetrics & Gynecology

## 2017-11-11 ENCOUNTER — Other Ambulatory Visit: Payer: Medicaid Other

## 2017-12-17 ENCOUNTER — Other Ambulatory Visit (HOSPITAL_COMMUNITY)
Admission: RE | Admit: 2017-12-17 | Discharge: 2017-12-17 | Disposition: A | Discharge status: Home / Self Care | Payer: Medicaid Other | Source: Ambulatory Visit | Attending: Advanced Practice Midwife | Admitting: Advanced Practice Midwife

## 2017-12-17 ENCOUNTER — Ambulatory Visit (INDEPENDENT_AMBULATORY_CARE_PROVIDER_SITE_OTHER): Payer: Medicaid Other | Admitting: Advanced Practice Midwife

## 2017-12-17 ENCOUNTER — Encounter: Payer: Self-pay | Admitting: Advanced Practice Midwife

## 2017-12-17 VITALS — BP 100/67 | HR 72 | Wt 116.8 lb

## 2017-12-17 DIAGNOSIS — Z3481 Encounter for supervision of other normal pregnancy, first trimester: Secondary | ICD-10-CM | POA: Insufficient documentation

## 2017-12-17 DIAGNOSIS — Z3480 Encounter for supervision of other normal pregnancy, unspecified trimester: Secondary | ICD-10-CM

## 2017-12-17 DIAGNOSIS — Z3A10 10 weeks gestation of pregnancy: Secondary | ICD-10-CM | POA: Insufficient documentation

## 2017-12-17 DIAGNOSIS — Z3401 Encounter for supervision of normal first pregnancy, first trimester: Secondary | ICD-10-CM

## 2017-12-17 DIAGNOSIS — Z23 Encounter for immunization: Secondary | ICD-10-CM

## 2017-12-17 NOTE — Progress Notes (Signed)
Subjective:   Morgan Beltran is a 22 y.o. G4P1011 at [redacted]w[redacted]d by early ultrasound being seen today for her first obstetrical visit.  Her obstetrical history is significant for fetal demise at 8 weeks 06/27/2017. Patient does intend to breast feed. Pregnancy history fully reviewed.  Patient reports nausea and vomiting. States she is able to tolerate food after she vomits. Denies medication or other treatments. "I just throw up then get over it".   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 Patient lives with her son. Endorses large support network. Denies concerns for IPV.  Patient states she received care for her nausea/vomiting at [facility] ED in [place]. Providers there performed an ultrasound, confirmed IUP, identified EDD of 07/11/2018.  HISTORY: OB History  Gravida Para Term Preterm AB Living  4 1 1  0 1 1  SAB TAB Ectopic Multiple Live Births  1 0 0 0 1    # Outcome Date GA Lbr Len/2nd Weight Sex Delivery Anes PTL Lv  4 Current           3 SAB 04/09/17     SAB     2 Term 11/19/12 [redacted]w[redacted]d 21:35 / 01:28 7 lb 10.9 oz (3.485 kg) M Vag-Spont EPI  LIV     Birth Comments: WNL     Name: Morgan Beltran     Apgar1: 6  Apgar5: 8  1 Gravida             Last pap smear was done 06/02/2017 and was normal  Past Medical History:  Diagnosis Date  . Asthma    childhood asthma, no inaher, no prob as adult  . Cervicitis 2014  . SVD (spontaneous vaginal delivery)    x 1   Past Surgical History:  Procedure Laterality Date  . DILATION AND EVACUATION N/A 06/27/2017   Procedure: DILATATION AND EVACUATION;  Surgeon: Tereso Newcomer, MD;  Location: [facility];  Service: Gynecology;  Laterality: N/A;  . WISDOM TOOTH EXTRACTION    . WISDOM TOOTH EXTRACTION  2012   Family History  Problem Relation Age of Onset  . Lupus Mother   . Cerebral palsy Brother        preterm delivery/mother has lupus  . Diabetes Maternal  Grandmother   . Hypertension Maternal Grandmother    Social History   Tobacco Use  . Smoking status: Never Smoker  . Smokeless tobacco: Never Used  Substance Use Topics  . Alcohol use: No  . Drug use: No   Allergies  Allergen Reactions  . Penicillins Hives    Has patient had a PCN reaction causing immediate rash, facial/tongue/throat swelling, SOB or lightheadedness with hypotension: yes Has patient had a PCN reaction causing severe rash involving mucus membranes or skin necrosis: no Has patient had a PCN reaction that required hospitalization: no Has patient had a PCN reaction occurring within the last 10 years: no If all of the above answers are "NO", then may proceed with Cephalosporin use.   Marland Kitchen Amoxicillin Rash   Current Outpatient Medications on File Prior to Visit  Medication Sig Dispense Refill  . acetaminophen (TYLENOL) 500 MG tablet Take 500 mg by mouth every 8 (eight) hours as needed.    . docusate sodium (COLACE) 100 MG capsule Take 1 capsule (100 mg total) by mouth 2 (two) times daily as needed for mild constipation or moderate constipation. (Patient not taking: Reported on 12/17/2017) 30 capsule 2  .  doxylamine, Sleep, (UNISOM) 25 MG tablet Take 12.5 mg by mouth at bedtime.    Marland Kitchen ibuprofen (ADVIL,MOTRIN) 600 MG tablet Take 1 tablet (600 mg total) by mouth every 6 (six) hours as needed for headache, mild pain, moderate pain or cramping. (Patient not taking: Reported on 12/17/2017) 60 tablet 2  . traMADol (ULTRAM) 50 MG tablet Take 1-2 tablets (50-100 mg total) by mouth every 6 (six) hours as needed for severe pain. (Patient not taking: Reported on 12/17/2017) 20 tablet 0   No current facility-administered medications on file prior to visit.     Review of Systems Pertinent items noted in HPI and remainder of comprehensive ROS otherwise negative.  Exam   Vitals:   12/17/17 1109  BP: 100/67  Pulse: 72  Weight: 116 lb 12.8 oz (53 kg)      Uterus:     Pelvic Exam:  Perineum: no hemorrhoids, normal perineum   Vulva: normal external genitalia, no lesions   Vagina:  normal mucosa, normal discharge   Cervix: no lesions and normal, pap smear done.    Adnexa: normal adnexa and no mass, fullness, tenderness   Bony Pelvis: average  System: General: well-developed, well-nourished female in no acute distress   Breasts:  normal appearance, no masses or tenderness bilaterally   Skin: normal coloration and turgor, no rashes   Neurologic: oriented, normal, negative, normal mood   Extremities: normal strength, tone, and muscle mass, ROM of all joints is normal   HEENT PERRLA, extraocular movement intact and sclera clear, anicteric   Mouth/Teeth mucous membranes moist, pharynx normal without lesions and dental hygiene good   Neck supple and no masses   Cardiovascular: regular rate and rhythm   Respiratory:  no respiratory distress, normal breath sounds   Abdomen: soft, non-tender; bowel sounds normal; no masses,  no organomegaly    Assessment:   Pregnancy: [id number] Patient Active Problem List   Diagnosis Date Noted  . Supervision of other normal pregnancy, antepartum 12/17/2017  . Intrauterine fetal demise at [redacted] weeks gestation 06/24/2017  . Asthma 04/28/2012     Plan:  1. Encounter for supervision of normal first pregnancy in first trimester - No questions or concerns, continue routine care - P1 = term SVD - Declined rx for PNV - S/p Pap 05/2017, not performed today - Hemoglobinopathy evaluation - SMN1 Copy Number Analysis - Obstetric Panel, Including HIV - Urine Culture - [place] MFM OB COMP + 14 WK; Future - GC/Chlamydia probe amp ([facility])not at [facility] - Flu shot today  Records request completed for ultrasound and care provided at [facility] ED in [facility]  Initial labs drawn. Continue prenatal vitamins. Genetic Screening discussed includes First trimester screen, Quad screen and NIPS. NIPS ordered. Ultrasound discussed; fetal anatomic survey:  ordered. Problem list reviewed and updated. The nature of [facility] with multiple MDs and other Advanced Practice Providers was explained to patient; also emphasized that residents, students are part of our team. Routine obstetric precautions reviewed. No follow-ups on file.  Clayton Bibles Certified Nurse Midwife, Biochemist, clinical for [employer], [employer]

## 2017-12-17 NOTE — Patient Instructions (Signed)
First Trimester of Pregnancy The first trimester of pregnancy is from week 1 until the end of week 13 (months 1 through 3). During this time, your baby will begin to develop inside you. At 6-8 weeks, the eyes and face are formed, and the heartbeat can be seen on ultrasound. At the end of 12 weeks, all the baby's organs are formed. Prenatal care is all the medical care you receive before the birth of your baby. Make sure you get good prenatal care and follow all of your doctor's instructions. Follow these instructions at home: Medicines  Take over-the-counter and prescription medicines only as told by your doctor. Some medicines are safe and some medicines are not safe during pregnancy.  Take a prenatal vitamin that contains at least 600 micrograms (mcg) of folic acid.  If you have trouble pooping (constipation), take medicine that will make your stool soft (stool softener) if your doctor approves. Eating and drinking  Eat regular, healthy meals.  Your doctor will tell you the amount of weight gain that is right for you.  Avoid raw meat and uncooked cheese.  If you feel sick to your stomach (nauseous) or throw up (vomit): ? Eat 4 or 5 small meals a day instead of 3 large meals. ? Try eating a few soda crackers. ? Drink liquids between meals instead of during meals.  To prevent constipation: ? Eat foods that are high in fiber, like fresh fruits and vegetables, whole grains, and beans. ? Drink enough fluids to keep your pee (urine) clear or pale yellow. Activity  Exercise only as told by your doctor. Stop exercising if you have cramps or pain in your lower belly (abdomen) or low back.  Do not exercise if it is too hot, too humid, or if you are in a place of great height (high altitude).  Try to avoid standing for long periods of time. Move your legs often if you must stand in one place for a long time.  Avoid heavy lifting.  Wear low-heeled shoes. Sit and stand up straight.  You  can have sex unless your doctor tells you not to. Relieving pain and discomfort  Wear a good support bra if your breasts are sore.  Take warm water baths (sitz baths) to soothe pain or discomfort caused by hemorrhoids. Use hemorrhoid cream if your doctor says it is okay.  Rest with your legs raised if you have leg cramps or low back pain.  If you have puffy, bulging veins (varicose veins) in your legs: ? Wear support hose or compression stockings as told by your doctor. ? Raise (elevate) your feet for 15 minutes, 3-4 times a day. ? Limit salt in your food. Prenatal care  Schedule your prenatal visits by the twelfth week of pregnancy.  Write down your questions. Take them to your prenatal visits.  Keep all your prenatal visits as told by your doctor. This is important. Safety  Wear your seat belt at all times when driving.  Make a list of emergency phone numbers. The list should include numbers for family, friends, the hospital, and police and fire departments. General instructions  Ask your doctor for a referral to a local prenatal class. Begin classes no later than at the start of month 6 of your pregnancy.  Ask for help if you need counseling or if you need help with nutrition. Your doctor can give you advice or tell you where to go for help.  Do not use hot tubs, steam rooms, or   saunas.  Do not douche or use tampons or scented sanitary pads.  Do not cross your legs for long periods of time.  Avoid all herbs and alcohol. Avoid drugs that are not approved by your doctor.  Do not use any tobacco products, including cigarettes, chewing tobacco, and electronic cigarettes. If you need help quitting, ask your doctor. You may get counseling or other support to help you quit.  Avoid cat litter boxes and soil used by cats. These carry germs that can cause birth defects in the baby and can cause a loss of your baby (miscarriage) or stillbirth.  Visit your dentist. At home, brush  your teeth with a soft toothbrush. Be gentle when you floss. Contact a doctor if:  You are dizzy.  You have mild cramps or pressure in your lower belly.  You have a nagging pain in your belly area.  You continue to feel sick to your stomach, you throw up, or you have watery poop (diarrhea).  You have a bad smelling fluid coming from your vagina.  You have pain when you pee (urinate).  You have increased puffiness (swelling) in your face, hands, legs, or ankles. Get help right away if:  You have a fever.  You are leaking fluid from your vagina.  You have spotting or bleeding from your vagina.  You have very bad belly cramping or pain.  You gain or lose weight rapidly.  You throw up blood. It may look like coffee grounds.  You are around people who have German measles, fifth disease, or chickenpox.  You have a very bad headache.  You have shortness of breath.  You have any kind of trauma, such as from a fall or a car accident. Summary  The first trimester of pregnancy is from week 1 until the end of week 13 (months 1 through 3).  To take care of yourself and your unborn baby, you will need to eat healthy meals, take medicines only if your doctor tells you to do so, and do activities that are safe for you and your baby.  Keep all follow-up visits as told by your doctor. This is important as your doctor will have to ensure that your baby is healthy and growing well. This information is not intended to replace advice given to you by your health care provider. Make sure you discuss any questions you have with your health care provider. Document Released: 07/24/2007 Document Revised: 02/13/2016 Document Reviewed: 02/13/2016 Elsevier Interactive Patient Education  2017 Elsevier Inc.  

## 2017-12-17 NOTE — Progress Notes (Signed)
LAST PAP 06/02/2017

## 2017-12-18 ENCOUNTER — Encounter: Payer: Self-pay | Admitting: *Deleted

## 2017-12-18 LAB — OBSTETRIC PANEL, INCLUDING HIV
ANTIBODY SCREEN: NEGATIVE
BASOS: 0 %
Basophils Absolute: 0 10*3/uL (ref 0.0–0.2)
EOS (ABSOLUTE): 0.1 10*3/uL (ref 0.0–0.4)
Eos: 1 %
HEMATOCRIT: 32.1 % — AB (ref 34.0–46.6)
HEMOGLOBIN: 11.3 g/dL (ref 11.1–15.9)
HEP B S AG: NEGATIVE
HIV Screen 4th Generation wRfx: NONREACTIVE
IMMATURE GRANS (ABS): 0 10*3/uL (ref 0.0–0.1)
IMMATURE GRANULOCYTES: 0 %
LYMPHS: 39 %
Lymphocytes Absolute: 2.3 10*3/uL (ref 0.7–3.1)
MCH: 30.9 pg (ref 26.6–33.0)
MCHC: 35.2 g/dL (ref 31.5–35.7)
MCV: 88 fL (ref 79–97)
MONOS ABS: 0.5 10*3/uL (ref 0.1–0.9)
Monocytes: 9 %
Neutrophils Absolute: 3 10*3/uL (ref 1.4–7.0)
Neutrophils: 51 %
Platelets: 224 10*3/uL (ref 150–450)
RBC: 3.66 x10E6/uL — ABNORMAL LOW (ref 3.77–5.28)
RDW: 12.5 % (ref 12.3–15.4)
RH TYPE: POSITIVE
RPR: NONREACTIVE
RUBELLA: 0.9 {index} — AB (ref >0.99)
WBC: 5.9 10*3/uL (ref 3.4–10.8)

## 2017-12-18 LAB — URINE CULTURE: ORGANISM ID, BACTERIA: NO GROWTH

## 2017-12-18 LAB — GC/CHLAMYDIA PROBE AMP (~~LOC~~) NOT AT ARMC
Chlamydia: NEGATIVE
Neisseria Gonorrhea: NEGATIVE

## 2017-12-19 ENCOUNTER — Encounter: Payer: Self-pay | Admitting: Radiology

## 2017-12-24 LAB — SMN1 COPY NUMBER ANALYSIS (SMA CARRIER SCREENING)

## 2017-12-24 LAB — HEMOGLOBINOPATHY EVALUATION
HEMOGLOBIN A2 QUANTITATION: 2.2 % (ref 1.8–3.2)
HGB C: 0 %
HGB S: 0 %
HGB VARIANT: 0 %
Hemoglobin F Quantitation: 0 % (ref 0.0–2.0)
Hgb A: 97.8 % (ref 96.4–98.8)

## 2017-12-31 ENCOUNTER — Encounter: Payer: Self-pay | Admitting: Advanced Practice Midwife

## 2017-12-31 DIAGNOSIS — Z283 Underimmunization status: Secondary | ICD-10-CM | POA: Insufficient documentation

## 2017-12-31 DIAGNOSIS — O09899 Supervision of other high risk pregnancies, unspecified trimester: Secondary | ICD-10-CM | POA: Insufficient documentation

## 2017-12-31 DIAGNOSIS — O9989 Other specified diseases and conditions complicating pregnancy, childbirth and the puerperium: Secondary | ICD-10-CM

## 2018-01-12 ENCOUNTER — Encounter: Payer: Self-pay | Admitting: Radiology

## 2018-01-14 ENCOUNTER — Encounter: Payer: Medicaid Other | Admitting: Advanced Practice Midwife

## 2018-01-14 ENCOUNTER — Encounter: Payer: Self-pay | Admitting: Advanced Practice Midwife

## 2018-01-14 ENCOUNTER — Ambulatory Visit (INDEPENDENT_AMBULATORY_CARE_PROVIDER_SITE_OTHER): Payer: Medicaid Other | Admitting: Advanced Practice Midwife

## 2018-01-14 VITALS — BP 121/84 | HR 72 | Wt 117.2 lb

## 2018-01-14 DIAGNOSIS — Z9889 Other specified postprocedural states: Secondary | ICD-10-CM

## 2018-01-14 DIAGNOSIS — O021 Missed abortion: Secondary | ICD-10-CM | POA: Diagnosis not present

## 2018-01-14 DIAGNOSIS — Z8759 Personal history of other complications of pregnancy, childbirth and the puerperium: Secondary | ICD-10-CM | POA: Diagnosis not present

## 2018-01-14 DIAGNOSIS — O2620 Pregnancy care for patient with recurrent pregnancy loss, unspecified trimester: Secondary | ICD-10-CM | POA: Insufficient documentation

## 2018-01-14 NOTE — Progress Notes (Signed)
GYNECOLOGY ANNUAL PREVENTATIVE CARE ENCOUNTER NOTE  Subjective:   Morgan Beltran is a 22 y.o. 594P1011 female here for follow up after IUFD in first trimester and [facility].  Current complaints: none. Patient endorses two weeks of moderate vaginal bleeding immediately following her procedure on 12/23/17. Then her bleeding transitioned to dark spotting with occasional days of no discharge or bleeding. She denies abdominal cramping, discharge, pelvic pain, problems with intercourse or other gynecologic concerns.    Gynecologic History Patient's last menstrual period was 09/24/2017 (approximate). Contraception: none Last Pap: 06/02/17. Results were: normal with negative HPV Last mammogram: N/A age 22.   Obstetric History OB History  Gravida Para Term Preterm AB Living  4 1 1  0 1 1  SAB TAB Ectopic Multiple Live Births  1 0 0 0 1    # Outcome Date GA Lbr Len/2nd Weight Sex Delivery Anes PTL Lv  4 Gravida           3 SAB 04/09/17     SAB     2 Term 11/19/12 [id number] 21:35 / 01:28 7 lb 10.9 oz (3.485 kg) M Vag-Spont EPI  LIV     Birth Comments: WNL  1 Gravida             Past Medical History:  Diagnosis Date  . Asthma    childhood asthma, no inaher, no prob as adult  . Cervicitis 2014  . SVD (spontaneous vaginal delivery)    x 1    Past Surgical History:  Procedure Laterality Date  . DILATION AND EVACUATION N/A 06/27/2017   Procedure: DILATATION AND EVACUATION;  Surgeon: Tereso NewcomerAnyanwu, Ugonna A, MD;  Location: [facility];  Service: Gynecology;  Laterality: N/A;  . WISDOM TOOTH EXTRACTION    . WISDOM TOOTH EXTRACTION  2012    Current Outpatient Medications on File Prior to Visit  Medication Sig Dispense Refill  . acetaminophen (TYLENOL) 500 MG tablet Take 500 mg by mouth every 8 (eight) hours as needed.    . docusate sodium (COLACE) 100 MG capsule Take 1 capsule (100 mg total) by mouth 2 (two) times daily as needed for mild constipation or moderate constipation.  (Patient not taking: Reported on 12/17/2017) 30 capsule 2  . doxylamine, Sleep, (UNISOM) 25 MG tablet Take 12.5 mg by mouth at bedtime.    Marland Kitchen. ibuprofen (ADVIL,MOTRIN) 600 MG tablet Take 1 tablet (600 mg total) by mouth every 6 (six) hours as needed for headache, mild pain, moderate pain or cramping. (Patient not taking: Reported on 12/17/2017) 60 tablet 2  . traMADol (ULTRAM) 50 MG tablet Take 1-2 tablets (50-100 mg total) by mouth every 6 (six) hours as needed for severe pain. (Patient not taking: Reported on 12/17/2017) 20 tablet 0   No current facility-administered medications on file prior to visit.     Allergies  Allergen Reactions  . Penicillins Hives    Has patient had a PCN reaction causing immediate rash, facial/tongue/throat swelling, SOB or lightheadedness with hypotension: yes Has patient had a PCN reaction causing severe rash involving mucus membranes or skin necrosis: no Has patient had a PCN reaction that required hospitalization: no Has patient had a PCN reaction occurring within the last 10 years: no If all of the above answers are "NO", then may proceed with Cephalosporin use.   Marland Kitchen. Amoxicillin Rash    Social History:  reports that she has never smoked. She has never used smokeless tobacco. She reports that she does not drink alcohol  or use drugs.  Family History  Problem Relation Age of Onset  . Lupus Mother   . Cerebral palsy Brother        preterm delivery/mother has lupus  . Diabetes Maternal Grandmother   . Hypertension Maternal Grandmother     The following portions of the patient's history were reviewed and updated as appropriate: allergies, current medications, past family history, past medical history, past social history, past surgical history and problem list.  Review of Systems Pertinent items noted in HPI and remainder of comprehensive ROS otherwise negative.   Objective:  BP 121/84   Pulse 72   Wt 117 lb 3.2 oz (53.2 kg)   LMP 09/24/2017  (Approximate)   Breastfeeding? Unknown   BMI 22.89 kg/m  CONSTITUTIONAL: Well-developed, well-nourished female in no acute distress.  HENT:  Normocephalic, atraumatic, External right and left ear normal. Oropharynx is clear and moist EYES: Conjunctivae and EOM are normal. Pupils are equal, round, and reactive to light. No scleral icterus.  NECK: Normal range of motion, supple, no masses.  Normal thyroid.  SKIN: Skin is warm and dry. No rash noted. Not diaphoretic. No erythema. No pallor. MUSCULOSKELETAL: Normal range of motion. No tenderness.  No cyanosis, clubbing, or edema.  2+ distal pulses. NEUROLOGIC: Alert and oriented to person, place, and time. Normal reflexes, muscle tone coordination. No cranial nerve deficit noted. PSYCHIATRIC: Normal mood and affect. Normal behavior. Normal judgment and thought content. RESPIRATORY: Effort normal, no problems with respiration noted.  Assessment and Plan:  1. S/P dilation and curettage - [facility] records in [place] - Beta hCG quant (ref lab)  2. IUFD at less than 20 weeks of gestation - Diagnosed 12/18/17 - Discussed possible ovulation in 4-6 weeks - Patient given referral for [facility] fertility, unable to accommodate due to $300 fee to hold appointment - Discussed making a Pre-Conception appt with [facility] MD team - Declines contraception  Message sent to [facility], [place], LCSW on patient's behalf with patient's permission.  Routine preventative health maintenance measures emphasized. Please refer to After Visit Summary for other counseling recommendations.    Clayton Bibles Certified Nurse Midwife, Biochemist, clinical for [employer], [employer]

## 2018-01-14 NOTE — Progress Notes (Signed)
1/21

## 2018-01-15 LAB — BETA HCG QUANT (REF LAB): HCG QUANT: 8 m[IU]/mL

## 2018-02-13 ENCOUNTER — Ambulatory Visit (HOSPITAL_COMMUNITY): Payer: Medicaid Other

## 2018-02-18 NOTE — L&D Delivery Note (Addendum)
OB/GYN Faculty Practice Delivery Note  Morgan Beltran is a 23 y.o. [id number] s/p VD at [redacted]w[redacted]d. She was admitted for SOL after SROM.   ROM: 17h 3m with clear fluid GBS Status:  Negative/-- (10/19 1615) Maximum Maternal Temperature:  Temp (24hrs), Avg:98.5 F (36.9 C), Min:98.1 F (36.7 C), Max:98.9 F (37.2 C)   Labor Progress: . Patient arrived at 3.5 cm dilation and was induced with pitocin.   Delivery Date/Time: 12/21/2018 at 0355 Delivery: Called to room and patient was complete and pushing. Head delivered in ROA position. No nuchal cord present. Shoulder and body delivered in usual fashion. Infant with spontaneous cry, placed on mother's abdomen, dried and stimulated. Cord clamped x 2 after 1-minute delay, and cut by father of the baby under my direct supervision. Cord blood drawn. Placenta delivered spontaneously with gentle cord traction. Fundus firm with massage and Pitocin. Labia, perineum, vagina, and cervix inspected with right and left labial tears that were hemostatic and did not require repair.   Placenta: Spontaneous, intact, three-vessel cord Complications: None Lacerations: Right and left labial tears that did not require repair EBL: 125 mL Analgesia: Epidural in place  Infant: APGAR (1 MIN): 9   APGAR (5 MINS): 9    Weight: Pending  Gifford Shave, MD  PGY-1, [facility]  12/21/2018 4:13 AM  Patient is a 23 y.o. at [redacted]w[redacted]d who was admitted for spontaneous onset of labor and SROM, uncomplicated prenatal course.  She progressed with augmentation via pitocin at 8cm.  I was gloved and present for delivery in its entirety.  Second stage of labor progressed, baby delivered after 3 contractions.   Complications: none  [name], CNM 4:24 AM

## 2018-04-21 ENCOUNTER — Other Ambulatory Visit (INDEPENDENT_AMBULATORY_CARE_PROVIDER_SITE_OTHER): Payer: Medicaid Other

## 2018-04-21 VITALS — BP 97/60 | HR 75

## 2018-04-21 DIAGNOSIS — O2621 Pregnancy care for patient with recurrent pregnancy loss, first trimester: Secondary | ICD-10-CM

## 2018-04-21 DIAGNOSIS — Z3201 Encounter for pregnancy test, result positive: Secondary | ICD-10-CM | POA: Diagnosis not present

## 2018-04-21 LAB — POCT URINE PREGNANCY: PREG TEST UR: POSITIVE — AB

## 2018-04-21 MED ORDER — PROGESTERONE MICRONIZED 200 MG PO CAPS: 200.0000 mg | ORAL_CAPSULE | Freq: Every day | ORAL | 3 refills | Status: DC

## 2018-04-21 MED ORDER — FOLIC ACID 1 MG PO TABS: 1.0000 mg | ORAL_TABLET | Freq: Every day | ORAL | 10 refills | Status: DC

## 2018-04-21 NOTE — Progress Notes (Addendum)
Pt here today for UPT. Pt had positive UPT at home. Denies any issues at this time. Pt currently taking a prenatal vitamin.   UPT in office positve. LMP 03/23/2018. Pt is 4 weeks 1 day today. Due to previous SAB's, talked with Dr Macon Large about POC for patient at this time. Labs drawn, meds sent into pharmacy. Pt will make new OB appointment around 10-[redacted] weeks gestation.    Scheryl Marten, RN

## 2018-04-21 NOTE — Progress Notes (Signed)
I have reviewed the chart and agree with nursing staff's documentation of this patient's encounter. Hypercoagulable panel checked, also added TSH and HgA1C given history of 3 early SABs.  Prometrium 200 mg po daily ordered; also gave prenatal vitamins and folic acid supplementation.  Hoping for viable IUP, patient told to contact us or go to MAU for any pain, bleeding or other symptoms.   Jaynie Collins, MD 04/21/2018 3:36 PM

## 2018-04-27 LAB — HEMOGLOBIN A1C
ESTIMATED AVERAGE GLUCOSE: 100 mg/dL
Hgb A1c MFr Bld: 5.1 % (ref 4.8–5.6)

## 2018-04-27 LAB — CARDIOLIPIN ANTIBODIES, IGG, IGM, IGA
ANTICARDIOLIPIN IGG: 10 GPL U/mL (ref 0–14)
Anticardiolipin IgA: <9 APL U/mL (ref 0–11)
Anticardiolipin IgM: <9 MPL U/mL (ref 0–12)

## 2018-04-27 LAB — PROTEIN S ACTIVITY: Protein S Activity: 75 % (ref 63–140)

## 2018-04-27 LAB — PROTEIN S, TOTAL: Protein S Ag, Total: 82 % (ref 60–150)

## 2018-04-27 LAB — FACTOR 5 LEIDEN

## 2018-04-27 LAB — LUPUS ANTICOAGULANT PANEL
Dilute Viper Venom Time: 30.9 s (ref 0.0–47.0)
PTT Lupus Anticoagulant: 33.4 s (ref 0.0–51.9)

## 2018-04-27 LAB — TSH: TSH: 2.93 u[IU]/mL (ref 0.450–4.500)

## 2018-04-27 LAB — ANTITHROMBIN III: AntiThromb III Func: 104 % (ref 75–135)

## 2018-04-27 LAB — PROTEIN C, TOTAL: Protein C Antigen: 86 % (ref 60–150)

## 2018-04-27 LAB — PROTEIN C ACTIVITY: Protein C Activity: 94 % (ref 73–180)

## 2018-04-27 LAB — PROTHROMBIN GENE MUTATION

## 2018-05-04 ENCOUNTER — Other Ambulatory Visit: Payer: Self-pay

## 2018-05-04 ENCOUNTER — Ambulatory Visit (INDEPENDENT_AMBULATORY_CARE_PROVIDER_SITE_OTHER): Payer: Medicaid Other | Admitting: Obstetrics and Gynecology

## 2018-05-04 ENCOUNTER — Encounter: Payer: Self-pay | Admitting: Obstetrics and Gynecology

## 2018-05-04 VITALS — BP 97/62 | HR 72 | Wt 119.0 lb

## 2018-05-04 DIAGNOSIS — Z8742 Personal history of other diseases of the female genital tract: Secondary | ICD-10-CM

## 2018-05-04 DIAGNOSIS — O200 Threatened abortion: Secondary | ICD-10-CM

## 2018-05-04 NOTE — Progress Notes (Signed)
Follow up today from ED visit on Saturday for vaginal bleeding. To be seen today for follow up HCG. Pt had small amount pink tinged spotting yesterday, denies any cramping.

## 2018-05-06 DIAGNOSIS — O200 Threatened abortion: Secondary | ICD-10-CM | POA: Insufficient documentation

## 2018-05-06 NOTE — Progress Notes (Signed)
Obstetrics and Gynecology Visit Return Patient Evaluation  Appointment Date: 05/04/2018  Primary Care Provider: Patient, No Pcp Per  OBGYN Clinic: [facility]  Chief Complaint: f/u ED for VB  History of Present Illness:  Morgan Beltran is a 23 y.o. s/p 3/14 ED visit at [facility] for VB. Quant done and was [id number] and no u/s done. Pt is AB pos.   No current bleeding and no pain. +breast tenderness. No s/s of morning sickness yet.    Review of Systems:  as noted in the History of Present Illness.   Patient Active Problem List   Diagnosis Date Noted  . Pregnancy complicated by previous recurrent miscarriages x 3 01/14/2018  . Rubella non-immune status, antepartum 12/31/2017  . Asthma 04/28/2012   Medications:  Morgan Beltran had no medications administered during this visit. Current Outpatient Medications  Medication Sig Dispense Refill  . folic acid (FOLVITE) 1 MG tablet Take 1 tablet (1 mg total) by mouth daily. 30 tablet 10  . progesterone (PROMETRIUM) 200 MG capsule Take 1 capsule (200 mg total) by mouth daily. 30 capsule 3  . acetaminophen (TYLENOL) 500 MG tablet Take 500 mg by mouth every 8 (eight) hours as needed.    . docusate sodium (COLACE) 100 MG capsule Take 1 capsule (100 mg total) by mouth 2 (two) times daily as needed for mild constipation or moderate constipation. (Patient not taking: Reported on 12/17/2017) 30 capsule 2  . doxylamine, Sleep, (UNISOM) 25 MG tablet Take 12.5 mg by mouth at bedtime.    Marland Kitchen ibuprofen (ADVIL,MOTRIN) 600 MG tablet Take 1 tablet (600 mg total) by mouth every 6 (six) hours as needed for headache, mild pain, moderate pain or cramping. (Patient not taking: Reported on 12/17/2017) 60 tablet 2  . traMADol (ULTRAM) 50 MG tablet Take 1-2 tablets (50-100 mg total) by mouth every 6 (six) hours as needed for severe pain. (Patient not taking: Reported on 12/17/2017) 20 tablet 0   No current facility-administered  medications for this visit.     Allergies: is allergic to penicillins and amoxicillin.  Physical Exam:  BP 97/62   Pulse 72   Wt 119 lb (54 kg)   LMP 03/23/2018 (Exact Date)   BMI 23.24 kg/m  Body mass index is 23.24 kg/m. General appearance: Well nourished, well developed female in no acute distress.  Abdomen: diffusely non tender to palpation, non distended, and no masses, hernias Neuro/Psych:  Normal mood and affect.    Pelvic exam:  No blood on transvag probe  Radiology: On transvaginal u/s, +yolk sac and fetal pole seen and 38mm c/w 6/2 weeks. No cardiac motion seen yet   Assessment: pt doing well  Plan: D/w pt that still doesn't have the dx of a viable IUP but is progressing how she should. Recommend 2wk f/u in office u/s and d/w her that should have progressed more and have cardiac motion by then  Pt confirms on supplemental progesterone.  RTC: 2wks  Cornelia Copa MD Attending Center for [employer] [facility])

## 2018-05-18 ENCOUNTER — Encounter: Payer: Self-pay | Admitting: Obstetrics & Gynecology

## 2018-05-18 ENCOUNTER — Ambulatory Visit (INDEPENDENT_AMBULATORY_CARE_PROVIDER_SITE_OTHER): Payer: Medicaid Other | Admitting: Obstetrics & Gynecology

## 2018-05-18 ENCOUNTER — Other Ambulatory Visit: Payer: Self-pay

## 2018-05-18 VITALS — BP 99/65 | HR 76

## 2018-05-18 DIAGNOSIS — O3680X0 Pregnancy with inconclusive fetal viability, not applicable or unspecified: Secondary | ICD-10-CM | POA: Diagnosis not present

## 2018-05-18 DIAGNOSIS — Z8759 Personal history of other complications of pregnancy, childbirth and the puerperium: Principal | ICD-10-CM

## 2018-05-18 DIAGNOSIS — Z3A08 8 weeks gestation of pregnancy: Secondary | ICD-10-CM

## 2018-05-18 NOTE — Progress Notes (Signed)
Pt here today to repeat ultrasound for viability. FHR noted 158. Will follow up in 4 weeks for new OB visit.   Scheryl Marten, RN

## 2018-05-18 NOTE — Progress Notes (Signed)
Ultrasound images reviewed. Viable IUP at [redacted] weeks gestation. Already on prenatal vitamins and prometrium. New OB visit to be scheduled in about one month.  Jaynie Collins, MD, FACOG Obstetrician & Gynecologist, [facility] for [employer], [facility]

## 2018-06-02 ENCOUNTER — Telehealth: Payer: Self-pay | Admitting: Radiology

## 2018-06-02 NOTE — Telephone Encounter (Signed)
Left message to call [facility] to move New OB to 06/09/18 due to no MD

## 2018-06-08 ENCOUNTER — Encounter: Payer: Medicaid Other | Admitting: Family Medicine

## 2018-06-10 DIAGNOSIS — Z3480 Encounter for supervision of other normal pregnancy, unspecified trimester: Secondary | ICD-10-CM | POA: Insufficient documentation

## 2018-06-10 NOTE — Progress Notes (Signed)
History:   Morgan RouxKatelyn B Beltran is a 23 y.o. [phone] at 5070w3d by LMP being seen today for her first obstetrical visit.  Her obstetrical history is significant for recurrent miscarriages, last one was last year. Had TSVD in 2014. Patient does intend to breast feed. Pregnancy history fully reviewed.  Patient reports no complaints.      HISTORY: OB History  Gravida Para Term Preterm AB Living  5 1 1  0 3 1  SAB TAB Ectopic Multiple Live Births  3 0 0 0 1    # Outcome Date GA Lbr Len/2nd Weight Sex Delivery Anes PTL Lv  5 Current           4 SAB 04/09/17     SAB     3 Term 11/19/12 3629w4d 21:35 / 01:28 7 lb 10.9 oz (3.485 kg) M Vag-Spont EPI  LIV     Birth Comments: WNL     Name: Morgan Beltran     Apgar1: 6  Apgar5: 8  2 SAB           1 SAB             Last pap smear was done 06/01/2017 and was normal  Past Medical History:  Diagnosis Date  . Asthma    childhood asthma, no inaher, no prob as adult  . Cervicitis 2014  . SVD (spontaneous vaginal delivery)    x 1   Past Surgical History:  Procedure Laterality Date  . DILATION AND EVACUATION N/A 06/27/2017   Procedure: DILATATION AND EVACUATION;  Surgeon: Tereso NewcomerAnyanwu, Ugonna A, MD;  Location: [facility];  Service: Gynecology;  Laterality: N/A;  . WISDOM TOOTH EXTRACTION    . WISDOM TOOTH EXTRACTION  2012   Family History  Problem Relation Age of Onset  . Lupus Mother   . Cerebral palsy Brother        preterm delivery/mother has lupus  . Diabetes Maternal Grandmother   . Hypertension Maternal Grandmother    Social History   Tobacco Use  . Smoking status: Never Smoker  . Smokeless tobacco: Never Used  Substance Use Topics  . Alcohol use: No  . Drug use: No   Allergies  Allergen Reactions  . Penicillins Hives    Has patient had a PCN reaction causing immediate rash, facial/tongue/throat swelling, SOB or lightheadedness with hypotension: yes Has patient had a PCN reaction causing severe rash involving mucus membranes or  skin necrosis: no Has patient had a PCN reaction that required hospitalization: no Has patient had a PCN reaction occurring within the last 10 years: no If all of the above answers are "NO", then may proceed with Cephalosporin use.   Marland Kitchen. Amoxicillin Rash   Current Outpatient Medications on File Prior to Visit  Medication Sig Dispense Refill  . acetaminophen (TYLENOL) 500 MG tablet Take 500 mg by mouth every 8 (eight) hours as needed.    . doxylamine, Sleep, (UNISOM) 25 MG tablet Take 12.5 mg by mouth at bedtime.    . folic acid (FOLVITE) 1 MG tablet Take 1 tablet (1 mg total) by mouth daily. 30 tablet 10  . progesterone (PROMETRIUM) 200 MG capsule Take 1 capsule (200 mg total) by mouth daily. 30 capsule 3  . docusate sodium (COLACE) 100 MG capsule Take 1 capsule (100 mg total) by mouth 2 (two) times daily as needed for mild constipation or moderate constipation. (Patient not taking: Reported on 12/17/2017) 30 capsule 2  . ibuprofen (ADVIL,MOTRIN) 600 MG tablet Take 1  tablet (600 mg total) by mouth every 6 (six) hours as needed for headache, mild pain, moderate pain or cramping. (Patient not taking: Reported on 12/17/2017) 60 tablet 2  . traMADol (ULTRAM) 50 MG tablet Take 1-2 tablets (50-100 mg total) by mouth every 6 (six) hours as needed for severe pain. (Patient not taking: Reported on 12/17/2017) 20 tablet 0   No current facility-administered medications on file prior to visit.     Review of Systems Pertinent items noted in HPI and remainder of comprehensive ROS otherwise negative. Physical Exam:   Vitals:   06/11/18 1014  BP: 107/74  Pulse: 80  Weight: 119 lb (54 kg)   Fetal Heart Rate (bpm): 163 General: well-developed, well-nourished female in no acute distress  Breasts:  normal appearance, no masses or tenderness bilaterally  Skin: normal coloration and turgor, no rashes  Neurologic: oriented, normal, negative, normal mood  Extremities: normal strength, tone, and muscle mass,  ROM of all joints is normal  HEENT PERRLA, extraocular movement intact and sclera clear, anicteric  Mouth/Teeth mucous membranes moist, pharynx normal without lesions and dental hygiene good  Neck supple and no masses  Cardiovascular: regular rate and rhythm  Respiratory:  no respiratory distress, normal breath sounds  Abdomen: soft, non-tender; bowel sounds normal; no masses,  no organomegaly     Assessment:    Pregnancy: [id number] Patient Active Problem List   Diagnosis Date Noted  . Supervision of other normal pregnancy, antepartum 06/10/2018  . Threatened abortion affecting intrauterine pregnancy 05/06/2018  . Pregnancy complicated by previous recurrent miscarriages x 3 01/14/2018  . Rubella non-immune status, antepartum 12/31/2017  . Asthma 04/28/2012     Plan:    1. Supervision of other normal pregnancy, antepartum - Babyscripts Schedule Optimization - Obstetric Panel, Including HIV - Culture, OB Urine - Genetic Screening - GC/Chlamydia probe amp ([place])not at [facility] - Enroll Patient in Babyscripts - Comprehensive metabolic panel - [place] MFM OB COMP + 14 WK; Future  2.  Encounter for fetal anatomic survey - [place] MFM OB COMP + 14 WK; Future  Initial labs drawn. Continue prenatal vitamins. Genetic Screening discussed, NIPS: ordered. Ultrasound discussed; fetal anatomic survey: ordered. Problem list reviewed and updated. The nature of [place] [facility] with multiple MDs and other Advanced Practice Providers was explained to patient; also emphasized that residents, students are part of our team. Routine obstetric precautions reviewed. Return in about 8 weeks (around 08/06/2018) for Virtual OB Visit (20 weeks).     Jaynie Collins, MD, FACOG Obstetrician & Gynecologist, [facility] for [employer], [facility]

## 2018-06-11 ENCOUNTER — Ambulatory Visit (INDEPENDENT_AMBULATORY_CARE_PROVIDER_SITE_OTHER): Payer: Medicaid Other | Admitting: Obstetrics & Gynecology

## 2018-06-11 ENCOUNTER — Other Ambulatory Visit (HOSPITAL_COMMUNITY)
Admission: RE | Admit: 2018-06-11 | Discharge: 2018-06-11 | Disposition: A | Discharge status: Home / Self Care | Payer: Medicaid Other | Source: Ambulatory Visit | Attending: Family Medicine | Admitting: Family Medicine

## 2018-06-11 ENCOUNTER — Encounter: Payer: Self-pay | Admitting: Obstetrics & Gynecology

## 2018-06-11 ENCOUNTER — Other Ambulatory Visit: Payer: Self-pay

## 2018-06-11 ENCOUNTER — Encounter: Payer: Self-pay | Admitting: *Deleted

## 2018-06-11 VITALS — BP 107/74 | HR 80 | Wt 119.0 lb

## 2018-06-11 DIAGNOSIS — O2341 Unspecified infection of urinary tract in pregnancy, first trimester: Secondary | ICD-10-CM

## 2018-06-11 DIAGNOSIS — Z3480 Encounter for supervision of other normal pregnancy, unspecified trimester: Secondary | ICD-10-CM | POA: Insufficient documentation

## 2018-06-11 DIAGNOSIS — Z3401 Encounter for supervision of normal first pregnancy, first trimester: Secondary | ICD-10-CM

## 2018-06-11 DIAGNOSIS — Z3689 Encounter for other specified antenatal screening: Secondary | ICD-10-CM

## 2018-06-11 NOTE — Patient Instructions (Signed)
Return to office for any scheduled appointments. Call the office or go to the MAU at [place] [facility] if:  You begin to have strong, frequent contractions  Your water breaks.  Sometimes it is a big gush of fluid, sometimes it is just a trickle that keeps getting your panties wet or running down your legs  You have vaginal bleeding.  It is normal to have a small amount of spotting if your cervix was checked.   Any other obstetric concerns.    First Trimester of Pregnancy The first trimester of pregnancy is from week 1 until the end of week 13 (months 1 through 3). A week after a sperm fertilizes an egg, the egg will implant on the wall of the uterus. This embryo will begin to develop into a baby. Genes from you and your partner will form the baby. The female genes will determine whether the baby will be a boy or a girl. At 6-8 weeks, the eyes and face will be formed, and the heartbeat can be seen on ultrasound. At the end of 12 weeks, all the baby's organs will be formed. Now that you are pregnant, you will want to do everything you can to have a healthy baby. Two of the most important things are to get good prenatal care and to follow your health care provider's instructions. Prenatal care is all the medical care you receive before the baby's birth. This care will help prevent, find, and treat any problems during the pregnancy and childbirth. Body changes during your first trimester Your body goes through many changes during pregnancy. The changes vary from woman to woman.  You may gain or lose a couple of pounds at first.  You may feel sick to your stomach (nauseous) and you may throw up (vomit). If the vomiting is uncontrollable, call your health care provider.  You may tire easily.  You may develop headaches that can be relieved by medicines. All medicines should be approved by your health care provider.  You may urinate more often. Painful urination may mean you  have a bladder infection.  You may develop heartburn as a result of your pregnancy.  You may develop constipation because certain hormones are causing the muscles that push stool through your intestines to slow down.  You may develop hemorrhoids or swollen veins (varicose veins).  Your breasts may begin to grow larger and become tender. Your nipples may stick out more, and the tissue that surrounds them (areola) may become darker.  Your gums may bleed and may be sensitive to brushing and flossing.  Dark spots or blotches (chloasma, mask of pregnancy) may develop on your face. This will likely fade after the baby is born.  Your menstrual periods will stop.  You may have a loss of appetite.  You may develop cravings for certain kinds of food.  You may have changes in your emotions from day to day, such as being excited to be pregnant or being concerned that something may go wrong with the pregnancy and baby.  You may have more vivid and strange dreams.  You may have changes in your hair. These can include thickening of your hair, rapid growth, and changes in texture. Some women also have hair loss during or after pregnancy, or hair that feels dry or thin. Your hair will most likely return to normal after your baby is born. What to expect at prenatal visits During a routine prenatal visit:  You will be  weighed to make sure you and the baby are growing normally.  Your blood pressure will be taken.  Your abdomen will be measured to track your baby's growth.  The fetal heartbeat will be listened to between weeks 10 and 14 of your pregnancy.  Test results from any previous visits will be discussed. Your health care provider may ask you:  How you are feeling.  If you are feeling the baby move.  If you have had any abnormal symptoms, such as leaking fluid, bleeding, severe headaches, or abdominal cramping.  If you are using any tobacco products, including cigarettes, chewing  tobacco, and electronic cigarettes.  If you have any questions. Other tests that may be performed during your first trimester include:  Blood tests to find your blood type and to check for the presence of any previous infections. The tests will also be used to check for low iron levels (anemia) and protein on red blood cells (Rh antibodies). Depending on your risk factors, or if you previously had diabetes during pregnancy, you may have tests to check for high blood sugar that affects pregnant women (gestational diabetes).  Urine tests to check for infections, diabetes, or protein in the urine.  An ultrasound to confirm the proper growth and development of the baby.  Fetal screens for spinal cord problems (spina bifida) and Down syndrome.  HIV (human immunodeficiency virus) testing. Routine prenatal testing includes screening for HIV, unless you choose not to have this test.  You may need other tests to make sure you and the baby are doing well. Follow these instructions at home: Medicines  Follow your health care provider's instructions regarding medicine use. Specific medicines may be either safe or unsafe to take during pregnancy.  Take a prenatal vitamin that contains at least 600 micrograms (mcg) of folic acid.  If you develop constipation, try taking a stool softener if your health care provider approves. Eating and drinking   Eat a balanced diet that includes fresh fruits and vegetables, whole grains, good sources of protein such as meat, eggs, or tofu, and low-fat dairy. Your health care provider will help you determine the amount of weight gain that is right for you.  Avoid raw meat and uncooked cheese. These carry germs that can cause birth defects in the baby.  Eating four or five small meals rather than three large meals a day may help relieve nausea and vomiting. If you start to feel nauseous, eating a few soda crackers can be helpful. Drinking liquids between meals,  instead of during meals, also seems to help ease nausea and vomiting.  Limit foods that are high in fat and processed sugars, such as fried and sweet foods.  To prevent constipation: ? Eat foods that are high in fiber, such as fresh fruits and vegetables, whole grains, and beans. ? Drink enough fluid to keep your urine clear or pale yellow. Activity  Exercise only as directed by your health care provider. Most women can continue their usual exercise routine during pregnancy. Try to exercise for 30 minutes at least 5 days a week. Exercising will help you: ? Control your weight. ? Stay in shape. ? Be prepared for labor and delivery.  Experiencing pain or cramping in the lower abdomen or lower back is a good sign that you should stop exercising. Check with your health care provider before continuing with normal exercises.  Try to avoid standing for long periods of time. Move your legs often if you must stand in one  place for a long time.  Avoid heavy lifting.  Wear low-heeled shoes and practice good posture.  You may continue to have sex unless your health care provider tells you not to. Relieving pain and discomfort  Wear a good support bra to relieve breast tenderness.  Take warm sitz baths to soothe any pain or discomfort caused by hemorrhoids. Use hemorrhoid cream if your health care provider approves.  Rest with your legs elevated if you have leg cramps or low back pain.  If you develop varicose veins in your legs, wear support hose. Elevate your feet for 15 minutes, 3-4 times a day. Limit salt in your diet. Prenatal care  Schedule your prenatal visits by the twelfth week of pregnancy. They are usually scheduled monthly at first, then more often in the last 2 months before delivery.  Write down your questions. Take them to your prenatal visits.  Keep all your prenatal visits as told by your health care provider. This is important. Safety  Wear your seat belt at all times  when driving.  Make a list of emergency phone numbers, including numbers for family, friends, the hospital, and police and fire departments. General instructions  Ask your health care provider for a referral to a local prenatal education class. Begin classes no later than the beginning of month 6 of your pregnancy.  Ask for help if you have counseling or nutritional needs during pregnancy. Your health care provider can offer advice or refer you to specialists for help with various needs.  Do not use hot tubs, steam rooms, or saunas.  Do not douche or use tampons or scented sanitary pads.  Do not cross your legs for long periods of time.  Avoid cat litter boxes and soil used by cats. These carry germs that can cause birth defects in the baby and possibly loss of the fetus by miscarriage or stillbirth.  Avoid all smoking, herbs, alcohol, and medicines not prescribed by your health care provider. Chemicals in these products affect the formation and growth of the baby.  Do not use any products that contain nicotine or tobacco, such as cigarettes and e-cigarettes. If you need help quitting, ask your health care provider. You may receive counseling support and other resources to help you quit.  Schedule a dentist appointment. At home, brush your teeth with a soft toothbrush and be gentle when you floss. Contact a health care provider if:  You have dizziness.  You have mild pelvic cramps, pelvic pressure, or nagging pain in the abdominal area.  You have persistent nausea, vomiting, or diarrhea.  You have a bad smelling vaginal discharge.  You have pain when you urinate.  You notice increased swelling in your face, hands, legs, or ankles.  You are exposed to fifth disease or chickenpox.  You are exposed to [place] measles (rubella) and have never had it. Get help right away if:  You have a fever.  You are leaking fluid from your vagina.  You have spotting or bleeding from your  vagina.  You have severe abdominal cramping or pain.  You have rapid weight gain or loss.  You vomit blood or material that looks like coffee grounds.  You develop a severe headache.  You have shortness of breath.  You have any kind of trauma, such as from a fall or a car accident. Summary  The first trimester of pregnancy is from week 1 until the end of week 13 (months 1 through 3).  Your body goes through  many changes during pregnancy. The changes vary from woman to woman.  You will have routine prenatal visits. During those visits, your health care provider will examine you, discuss any test results you may have, and talk with you about how you are feeling. This information is not intended to replace advice given to you by your health care provider. Make sure you discuss any questions you have with your health care provider. Document Released: 01/29/2001 Document Revised: 01/17/2016 Document Reviewed: 01/17/2016 Elsevier Interactive Patient Education  2019 [employer].

## 2018-06-12 LAB — OBSTETRIC PANEL, INCLUDING HIV
Antibody Screen: NEGATIVE
Basophils Absolute: 0 10*3/uL (ref 0.0–0.2)
Basos: 0 %
EOS (ABSOLUTE): 0 10*3/uL (ref 0.0–0.4)
Eos: 1 %
HIV Screen 4th Generation wRfx: NONREACTIVE
Hematocrit: 36.4 % (ref 34.0–46.6)
Hemoglobin: 12.7 g/dL (ref 11.1–15.9)
Hepatitis B Surface Ag: NEGATIVE
Immature Grans (Abs): 0 10*3/uL (ref 0.0–0.1)
Immature Granulocytes: 0 %
Lymphocytes Absolute: 1.9 10*3/uL (ref 0.7–3.1)
Lymphs: 22 %
MCH: 31.5 pg (ref 26.6–33.0)
MCHC: 34.9 g/dL (ref 31.5–35.7)
MCV: 90 fL (ref 79–97)
Monocytes Absolute: 0.6 10*3/uL (ref 0.1–0.9)
Monocytes: 7 %
Neutrophils Absolute: 6.2 10*3/uL (ref 1.4–7.0)
Neutrophils: 70 %
Platelets: 234 10*3/uL (ref 150–450)
RBC: 4.03 x10E6/uL (ref 3.77–5.28)
RDW: 12.5 % (ref 11.7–15.4)
RPR Ser Ql: NONREACTIVE
Rh Factor: POSITIVE
Rubella Antibodies, IGG: 0.94 index — ABNORMAL LOW (ref >0.99)
WBC: 8.9 10*3/uL (ref 3.4–10.8)

## 2018-06-12 LAB — COMPREHENSIVE METABOLIC PANEL
ALT: 14 IU/L (ref 0–32)
AST: 15 IU/L (ref 0–40)
Albumin/Globulin Ratio: 1.9 (ref 1.2–2.2)
Albumin: 4.6 g/dL (ref 3.9–5.0)
Alkaline Phosphatase: 51 IU/L (ref 39–117)
BUN/Creatinine Ratio: 15 (ref 9–23)
BUN: 9 mg/dL (ref 6–20)
Bilirubin Total: 0.4 mg/dL (ref 0.0–1.2)
CO2: 21 mmol/L (ref 20–29)
Calcium: 9.3 mg/dL (ref 8.7–10.2)
Chloride: 100 mmol/L (ref 96–106)
Creatinine, Ser: 0.62 mg/dL (ref 0.57–1.00)
GFR calc Af Amer: 147 mL/min/{1.73_m2} (ref >59)
GFR calc non Af Amer: 128 mL/min/{1.73_m2} (ref >59)
Globulin, Total: 2.4 g/dL (ref 1.5–4.5)
Glucose: 78 mg/dL (ref 65–99)
Potassium: 4.1 mmol/L (ref 3.5–5.2)
Sodium: 135 mmol/L (ref 134–144)
Total Protein: 7 g/dL (ref 6.0–8.5)

## 2018-06-12 LAB — GC/CHLAMYDIA PROBE AMP (~~LOC~~) NOT AT ARMC
Chlamydia: NEGATIVE
Neisseria Gonorrhea: NEGATIVE

## 2018-06-14 LAB — URINE CULTURE, OB REFLEX

## 2018-06-14 LAB — CULTURE, OB URINE

## 2018-06-15 DIAGNOSIS — O2341 Unspecified infection of urinary tract in pregnancy, first trimester: Secondary | ICD-10-CM | POA: Insufficient documentation

## 2018-06-15 MED ORDER — NITROFURANTOIN MONOHYD MACRO 100 MG PO CAPS: 100.0000 mg | ORAL_CAPSULE | Freq: Two times a day (BID) | ORAL | 1 refills | Status: DC

## 2018-06-15 NOTE — Addendum Note (Signed)
Addended by: Jaynie Collins A on: 06/15/2018 10:35 AM   Modules accepted: Orders

## 2018-06-18 ENCOUNTER — Encounter: Payer: Self-pay | Admitting: Radiology

## 2018-06-18 ENCOUNTER — Telehealth: Payer: Self-pay | Admitting: Radiology

## 2018-06-18 NOTE — Telephone Encounter (Signed)
Spoke with patient about Panorama results

## 2018-06-24 ENCOUNTER — Other Ambulatory Visit: Payer: Self-pay

## 2018-06-24 ENCOUNTER — Other Ambulatory Visit (HOSPITAL_COMMUNITY)
Admission: RE | Admit: 2018-06-24 | Discharge: 2018-06-24 | Disposition: A | Discharge status: Home / Self Care | Payer: Medicaid Other | Source: Ambulatory Visit | Attending: Advanced Practice Midwife | Admitting: Advanced Practice Midwife

## 2018-06-24 ENCOUNTER — Ambulatory Visit (INDEPENDENT_AMBULATORY_CARE_PROVIDER_SITE_OTHER): Payer: Medicaid Other | Admitting: Advanced Practice Midwife

## 2018-06-24 VITALS — BP 103/67 | HR 71 | Wt 118.0 lb

## 2018-06-24 DIAGNOSIS — O26891 Other specified pregnancy related conditions, first trimester: Secondary | ICD-10-CM

## 2018-06-24 DIAGNOSIS — N898 Other specified noninflammatory disorders of vagina: Secondary | ICD-10-CM | POA: Insufficient documentation

## 2018-06-24 DIAGNOSIS — M545 Low back pain: Secondary | ICD-10-CM

## 2018-06-24 DIAGNOSIS — O26892 Other specified pregnancy related conditions, second trimester: Secondary | ICD-10-CM | POA: Diagnosis present

## 2018-06-24 DIAGNOSIS — Z3A13 13 weeks gestation of pregnancy: Secondary | ICD-10-CM

## 2018-06-24 MED ORDER — TERCONAZOLE 0.8 % VA CREA: 1.0000 | TOPICAL_CREAM | Freq: Every day | VAGINAL | 0 refills | Status: DC

## 2018-06-24 NOTE — Progress Notes (Signed)
OB PROBLEM CARE ENCOUNTER NOTE  History:     Morgan Beltran is a 23 y.o. [phone] at [redacted]w[redacted]d  female here for evaluation of multiple complaints.  Patient reports five day history of whitish green vaginal discharge. She also complains of 3 day history of upper abdominal pain and low back pain. Her low back pain is focused on the sacroiliac region of her spine and radiates distally across her buttocks. Her upper abdominal pain is described as "like stretching" and "as if my bra is too tight and I need to loosen it".  Denies lower abdominal cramping, abnormal vaginal bleeding,  pelvic pain, problems with intercourse or other gynecologic concerns.  She is remote from sexual intercourse and denies concern for STIs.   Obstetric History OB History  Gravida Para Term Preterm AB Living  5 1 1  0 3 1  SAB TAB Ectopic Multiple Live Births  3 0 0 0 1    # Outcome Date GA Lbr Len/2nd Weight Sex Delivery Anes PTL Lv  5 Current           4 SAB 04/09/17     SAB     3 Term 11/19/12 [redacted]w[redacted]d 21:35 / 01:28 7 lb 10.9 oz (3.485 kg) M Vag-Spont EPI  LIV     Birth Comments: WNL  2 SAB           1 SAB             Past Medical History:  Diagnosis Date  . Asthma    childhood asthma, no inaher, no prob as adult  . Cervicitis 2014  . SVD (spontaneous vaginal delivery)    x 1    Past Surgical History:  Procedure Laterality Date  . DILATION AND EVACUATION N/A 06/27/2017   Procedure: DILATATION AND EVACUATION;  Surgeon: Tereso Newcomer, MD;  Location: [facility];  Service: Gynecology;  Laterality: N/A;  . WISDOM TOOTH EXTRACTION    . WISDOM TOOTH EXTRACTION  2012    Current Outpatient Medications on File Prior to Visit  Medication Sig Dispense Refill  . acetaminophen (TYLENOL) 500 MG tablet Take 500 mg by mouth every 8 (eight) hours as needed.    . doxylamine, Sleep, (UNISOM) 25 MG tablet Take 12.5 mg by mouth at bedtime.    . folic acid (FOLVITE) 1 MG tablet Take 1 tablet (1 mg total) by mouth daily. 30  tablet 10  . docusate sodium (COLACE) 100 MG capsule Take 1 capsule (100 mg total) by mouth 2 (two) times daily as needed for mild constipation or moderate constipation. (Patient not taking: Reported on 12/17/2017) 30 capsule 2  . ibuprofen (ADVIL,MOTRIN) 600 MG tablet Take 1 tablet (600 mg total) by mouth every 6 (six) hours as needed for headache, mild pain, moderate pain or cramping. (Patient not taking: Reported on 12/17/2017) 60 tablet 2  . nitrofurantoin, macrocrystal-monohydrate, (MACROBID) 100 MG capsule Take 1 capsule (100 mg total) by mouth 2 (two) times daily. 14 capsule 1  . progesterone (PROMETRIUM) 200 MG capsule Take 1 capsule (200 mg total) by mouth daily. 30 capsule 3  . traMADol (ULTRAM) 50 MG tablet Take 1-2 tablets (50-100 mg total) by mouth every 6 (six) hours as needed for severe pain. (Patient not taking: Reported on 12/17/2017) 20 tablet 0   No current facility-administered medications on file prior to visit.     Allergies  Allergen Reactions  . Penicillins Hives    Has patient had a PCN reaction causing immediate  rash, facial/tongue/throat swelling, SOB or lightheadedness with hypotension: yes Has patient had a PCN reaction causing severe rash involving mucus membranes or skin necrosis: no Has patient had a PCN reaction that required hospitalization: no Has patient had a PCN reaction occurring within the last 10 years: no If all of the above answers are "NO", then may proceed with Cephalosporin use.   Marland Kitchen. Amoxicillin Rash    Social History:  reports that she has never smoked. She has never used smokeless tobacco. She reports that she does not drink alcohol or use drugs.  Family History  Problem Relation Age of Onset  . Lupus Mother   . Cerebral palsy Brother        preterm delivery/mother has lupus  . Diabetes Maternal Grandmother   . Hypertension Maternal Grandmother     The following portions of the patient's history were reviewed and updated as appropriate:  allergies, current medications, past family history, past medical history, past social history, past surgical history and problem list.  Review of Systems Pertinent items noted in HPI and remainder of comprehensive ROS otherwise negative.  Physical Exam:  BP 103/67   Pulse 71   Wt 118 lb (53.5 kg)   LMP 03/23/2018 (Exact Date)   BMI 23.05 kg/m  CONSTITUTIONAL: Well-developed, well-nourished female in no acute distress.  HENT:  Normocephalic, atraumatic, External right and left ear normal. Oropharynx is clear and moist EYES: Conjunctivae and EOM are normal. Pupils are equal, round, and reactive to light. No scleral icterus.  NECK: Normal range of motion, supple, no masses.  Normal thyroid.  SKIN: Skin is warm and dry. No rash noted. Not diaphoretic. No erythema. No pallor. MUSCULOSKELETAL: Normal range of motion. No tenderness.  No cyanosis, clubbing, or edema.  2+ distal pulses. NEUROLOGIC: Alert and oriented to person, place, and time. Normal reflexes, muscle tone coordination. No cranial nerve deficit noted. PSYCHIATRIC: Normal mood and affect. Normal behavior. Normal judgment and thought content. CARDIOVASCULAR: Normal heart rate noted, regular rhythm RESPIRATORY: Clear to auscultation bilaterally. Effort and breath sounds normal, no problems with respiration noted. BREASTS: Symmetric in size. No masses, skin changes, nipple drainage, or lymphadenopathy. ABDOMEN: Soft, normal bowel sounds, no distention noted.  No tenderness, rebound or guarding. No CVA tenderness noted. PELVIC: Normal appearing external genitalia; normal appearing vaginal mucosa and cervix.  No bleeding noted. Thick white clusters of discharge noted on labia and throughout vaginal vault. Slightly foul-smelling.  Assessment and Plan:     FHT 154 by Doppler  1. Vaginal discharge during pregnancy in second trimester - Concern for rebound yeast infection after completion of abx for UTI - Treat presumptively for  candidiasis, rx to pharmacy - Cervicovaginal ancillary only( [place])  2.  Low back pain during pregnancy in second trimester - Low suspicion for preterm contractions - Pt to attempt management with PO Tylenol and warm shower  3. Upper abdominal pain - Pain absent during clinic visit, non-tender to palpation - Pt to track and journal symptoms - Sense of tightness may be musculoskeletal  Reviewed concerning symptoms which would necessitate triage in MAU including but not limited to new onset lower abdominal pain or vaginal bleeding.  Routine preventative health maintenance measures emphasized. Please refer to After Visit Summary for other counseling recommendations.      Jamesetta OrleansSamantha Elyzabeth Goatley,CNM [facility], [facility]

## 2018-06-24 NOTE — Progress Notes (Signed)
Lower back pain x 3 days. Yellow discharge x 5 days. Having upper abdominal pain. Denies any vaginal bleeding. Finished meds for UTI

## 2018-06-24 NOTE — Patient Instructions (Signed)

## 2018-06-26 LAB — CERVICOVAGINAL ANCILLARY ONLY
Bacterial vaginitis: NEGATIVE
Candida vaginitis: POSITIVE — AB
Chlamydia: NEGATIVE
Neisseria Gonorrhea: NEGATIVE
Trichomonas: NEGATIVE

## 2018-07-18 ENCOUNTER — Inpatient Hospital Stay (HOSPITAL_COMMUNITY)
Admission: AD | Admit: 2018-07-18 | Discharge: 2018-07-18 | Disposition: A | Discharge status: Home / Self Care | Payer: Medicaid Other | Attending: Obstetrics and Gynecology | Admitting: Obstetrics and Gynecology

## 2018-07-18 ENCOUNTER — Encounter (HOSPITAL_COMMUNITY): Payer: Self-pay | Admitting: *Deleted

## 2018-07-18 ENCOUNTER — Other Ambulatory Visit: Payer: Self-pay

## 2018-07-18 DIAGNOSIS — Z3A16 16 weeks gestation of pregnancy: Secondary | ICD-10-CM

## 2018-07-18 DIAGNOSIS — R112 Nausea with vomiting, unspecified: Secondary | ICD-10-CM

## 2018-07-18 DIAGNOSIS — O26892 Other specified pregnancy related conditions, second trimester: Secondary | ICD-10-CM

## 2018-07-18 DIAGNOSIS — K529 Noninfective gastroenteritis and colitis, unspecified: Secondary | ICD-10-CM

## 2018-07-18 DIAGNOSIS — Z880 Allergy status to penicillin: Secondary | ICD-10-CM | POA: Diagnosis not present

## 2018-07-18 LAB — URINALYSIS, ROUTINE W REFLEX MICROSCOPIC
Bilirubin Urine: NEGATIVE
Glucose, UA: NEGATIVE mg/dL
Hgb urine dipstick: NEGATIVE
Ketones, ur: 80 mg/dL — AB
Leukocytes,Ua: NEGATIVE
Nitrite: NEGATIVE
Protein, ur: 30 mg/dL — AB
Specific Gravity, Urine: 1.027 (ref 1.005–1.030)
pH: 5 (ref 5.0–8.0)

## 2018-07-18 LAB — COMPREHENSIVE METABOLIC PANEL
ALT: 18 U/L (ref 0–44)
AST: 21 U/L (ref 15–41)
Albumin: 2.8 g/dL — ABNORMAL LOW (ref 3.5–5.0)
Alkaline Phosphatase: 47 U/L (ref 38–126)
Anion gap: 9 (ref 5–15)
BUN: 5 mg/dL — ABNORMAL LOW (ref 6–20)
CO2: 22 mmol/L (ref 22–32)
Calcium: 8.4 mg/dL — ABNORMAL LOW (ref 8.9–10.3)
Chloride: 105 mmol/L (ref 98–111)
Creatinine, Ser: 0.61 mg/dL (ref 0.44–1.00)
GFR calc Af Amer: >60 mL/min (ref >60)
GFR calc non Af Amer: >60 mL/min (ref >60)
Glucose, Bld: 89 mg/dL (ref 70–99)
Potassium: 3.8 mmol/L (ref 3.5–5.1)
Sodium: 136 mmol/L (ref 135–145)
Total Bilirubin: 0.4 mg/dL (ref 0.3–1.2)
Total Protein: 6.1 g/dL — ABNORMAL LOW (ref 6.5–8.1)

## 2018-07-18 MED ORDER — LACTATED RINGERS IV BOLUS
1000.0000 mL | Freq: Once | INTRAVENOUS | Status: AC
  Administered 2018-07-18: 17:00:00 1000 mL via INTRAVENOUS

## 2018-07-18 MED ORDER — FAMOTIDINE IN NACL 20-0.9 MG/50ML-% IV SOLN
20.0000 mg | Freq: Once | INTRAVENOUS | Status: AC
  Administered 2018-07-18: 17:00:00 20 mg via INTRAVENOUS
  Filled 2018-07-18: qty 50

## 2018-07-18 MED ORDER — ACETAMINOPHEN 500 MG PO TABS
1000.0000 mg | ORAL_TABLET | Freq: Once | ORAL | Status: AC
  Administered 2018-07-18: 20:00:00 1000 mg via ORAL
  Filled 2018-07-18: qty 2

## 2018-07-18 MED ORDER — ONDANSETRON 8 MG PO TBDP: 8.0000 mg | ORAL_TABLET | Freq: Three times a day (TID) | ORAL | 0 refills | Status: DC | PRN

## 2018-07-18 MED ORDER — LACTATED RINGERS IV BOLUS: 1000.0000 mL | Freq: Once | INTRAVENOUS | Status: DC

## 2018-07-18 MED ORDER — SODIUM CHLORIDE 0.9 % IV SOLN
8.0000 mg | Freq: Once | INTRAVENOUS | Status: AC
  Administered 2018-07-18: 18:00:00 8 mg via INTRAVENOUS
  Filled 2018-07-18: qty 4

## 2018-07-18 NOTE — MAU Note (Signed)
Morgan Beltran is a 23 y.o. at [redacted]w[redacted]d here in MAU reporting: nausea, vomiting, and diarrhea for the past few days. Also having abdominal pain and a headache. Reports diarrhea x15 and emesis x3 in the past 24 hours.  Onset of complaint: ongoing for a couple of days  Pain score: headache 6/10, abdominal pain 8/10  Vitals:   07/18/18 1519  BP: (!) 94/55  Pulse: 90  Resp: 16  Temp: 98 F (36.7 C)  SpO2: 99%      Lab orders placed from triage: UA

## 2018-07-18 NOTE — Discharge Instructions (Signed)
Food Choices to Help Relieve Diarrhea, Adult  When you have diarrhea, the foods you eat and your eating habits are very important. Choosing the right foods and drinks can help:   Relieve diarrhea.   Replace lost fluids and nutrients.   Prevent dehydration.  What general guidelines should I follow?    Relieving diarrhea   Choose foods with less than 2 g or .07 oz. of fiber per serving.   Limit fats to less than 8 tsp (38 g or 1.34 oz.) a day.   Avoid the following:  ? Foods and beverages sweetened with high-fructose corn syrup, honey, or sugar alcohols such as xylitol, sorbitol, and mannitol.  ? Foods that contain a lot of fat or sugar.  ? Fried, greasy, or spicy foods.  ? High-fiber grains, breads, and cereals.  ? Raw fruits and vegetables.   Eat foods that are rich in probiotics. These foods include dairy products such as yogurt and fermented milk products. They help increase healthy bacteria in the stomach and intestines (gastrointestinal tract, or GI tract).   If you have lactose intolerance, avoid dairy products. These may make your diarrhea worse.   Take medicine to help stop diarrhea (antidiarrheal medicine) only as told by your health care provider.  Replacing nutrients   Eat small meals or snacks every 3-4 hours.   Eat bland foods, such as white rice, toast, or baked potato, until your diarrhea starts to get better. Gradually reintroduce nutrient-rich foods as tolerated or as told by your health care provider. This includes:  ? Well-cooked protein foods.  ? Peeled, seeded, and soft-cooked fruits and vegetables.  ? Low-fat dairy products.   Take vitamin and mineral supplements as told by your health care provider.  Preventing dehydration   Start by sipping water or a special solution to prevent dehydration (oral rehydration solution, ORS). Urine that is clear or pale yellow means that you are getting enough fluid.   Try to drink at least 8-10 cups of fluid each day to help replace lost  fluids.   You may add other liquids in addition to water, such as clear juice or decaffeinated sports drinks, as tolerated or as told by your health care provider.   Avoid drinks with caffeine, such as coffee, tea, or soft drinks.   Avoid alcohol.  What foods are recommended?         The items listed may not be a complete list. Talk with your health care provider about what dietary choices are best for you.  Grains  White rice. White, French, or pita breads (fresh or toasted), including plain rolls, buns, or bagels. White pasta. Saltine, soda, or graham crackers. Pretzels. Low-fiber cereal. Cooked cereals made with water (such as cornmeal, farina, or cream cereals). Plain muffins. Matzo. Melba toast. Zwieback.  Vegetables  Potatoes (without the skin). Most well-cooked and canned vegetables without skins or seeds. Tender lettuce.  Fruits  Apple sauce. Fruits canned in juice. Cooked apricots, cherries, grapefruit, peaches, pears, or plums. Fresh bananas and cantaloupe.  Meats and other protein foods  Baked or boiled chicken. Eggs. Tofu. Fish. Seafood. Smooth nut butters. Ground or well-cooked tender beef, ham, veal, lamb, pork, or poultry.  Dairy  Plain yogurt, kefir, and unsweetened liquid yogurt. Lactose-free milk, buttermilk, skim milk, or soy milk. Low-fat or nonfat hard cheese.  Beverages  Water. Low-calorie sports drinks. Fruit juices without pulp. Strained tomato and vegetable juices. Decaffeinated teas. Sugar-free beverages not sweetened with sugar alcohols. Oral rehydration solutions, if   approved by your health care provider.  Seasoning and other foods  Bouillon, broth, or soups made from recommended foods.  What foods are not recommended?  The items listed may not be a complete list. Talk with your health care provider about what dietary choices are best for you.  Grains  Whole grain, whole wheat, bran, or rye breads, rolls, pastas, and crackers. Wild or brown rice. Whole grain or bran cereals. Barley.  Oats and oatmeal. Corn tortillas or taco shells. Granola. Popcorn.  Vegetables  Raw vegetables. Fried vegetables. Cabbage, broccoli, Brussels sprouts, artichokes, baked beans, beet greens, corn, kale, legumes, peas, sweet potatoes, and yams. Potato skins. Cooked spinach and cabbage.  Fruits  Dried fruit, including raisins and dates. Raw fruits. Stewed or dried prunes. Canned fruits with syrup.  Meat and other protein foods  Fried or fatty meats. Deli meats. Chunky nut butters. Nuts and seeds. Beans and lentils. Bacon. Hot dogs. Sausage.  Dairy  High-fat cheeses. Whole milk, chocolate milk, and beverages made with milk, such as milk shakes. Half-and-half. Cream. sour cream. Ice cream.  Beverages  Caffeinated beverages (such as coffee, tea, soda, or energy drinks). Alcoholic beverages. Fruit juices with pulp. Prune juice. Soft drinks sweetened with high-fructose corn syrup or sugar alcohols. High-calorie sports drinks.  Fats and oils  Butter. Cream sauces. Margarine. Salad oils. Plain salad dressings. Olives. Avocados. Mayonnaise.  Sweets and desserts  Sweet rolls, doughnuts, and sweet breads. Sugar-free desserts sweetened with sugar alcohols such as xylitol and sorbitol.  Seasoning and other foods  Honey. Hot sauce. Chili powder. Gravy. Cream-based or milk-based soups. Pancakes and waffles.  Summary   When you have diarrhea, the foods you eat and your eating habits are very important.   Make sure you get at least 8-10 cups of fluid each day, or enough to keep your urine clear or pale yellow.   Eat bland foods and gradually reintroduce healthy, nutrient-rich foods as tolerated, or as told by your health care provider.   Avoid high-fiber, fried, greasy, or spicy foods.  This information is not intended to replace advice given to you by your health care provider. Make sure you discuss any questions you have with your health care provider.  Document Released: 04/27/2003 Document Revised: 02/02/2016 Document Reviewed:  02/02/2016  Elsevier Interactive Patient Education  2019 [employer]

## 2018-07-18 NOTE — MAU Provider Note (Signed)
History     CSN: [id number]  Arrival date and time: 07/18/18 1416   First Provider Initiated Contact with Patient 07/18/18 1652      Chief Complaint  Patient presents with  . Abdominal Pain  . Headache  . Emesis  . Nausea  . Diarrhea   HPI   Ms.Morgan Beltran is a 23 y.o. female [phone] @ [redacted]w[redacted]d here in MAU with complaints of nausea/vomiting/diarrhea. Symptoms started 2 days ago. The symptoms started with a burning sensation in her abdomen. She has had more than 10 episodes of diarrhea in 24 hours, and has vomited 1 time. No sick contacts. No fever. + chills. Was able to keep down water and juice. She has not taken any medications for the symptoms. She has pain in her abdomen only right before her BM.  No pain now 0/10  OB History    Gravida  5   Para  1   Term  1   Preterm  0   AB  3   Living  1     SAB  3   TAB  0   Ectopic  0   Multiple  0   Live Births  1           Past Medical History:  Diagnosis Date  . Asthma    childhood asthma, no inaher, no prob as adult  . Cervicitis 2014  . SVD (spontaneous vaginal delivery)    x 1    Past Surgical History:  Procedure Laterality Date  . DILATION AND EVACUATION N/A 06/27/2017   Procedure: DILATATION AND EVACUATION;  Surgeon: Tereso Newcomer, MD;  Location: [facility];  Service: Gynecology;  Laterality: N/A;  . WISDOM TOOTH EXTRACTION    . WISDOM TOOTH EXTRACTION  2012    Family History  Problem Relation Age of Onset  . Lupus Mother   . Cerebral palsy Brother        preterm delivery/mother has lupus  . Diabetes Maternal Grandmother   . Hypertension Maternal Grandmother     Social History   Tobacco Use  . Smoking status: Never Smoker  . Smokeless tobacco: Never Used  Substance Use Topics  . Alcohol use: No  . Drug use: No    Allergies:  Allergies  Allergen Reactions  . Penicillins Hives    Has patient had a PCN reaction causing immediate rash, facial/tongue/throat swelling, SOB or  lightheadedness with hypotension: yes Has patient had a PCN reaction causing severe rash involving mucus membranes or skin necrosis: no Has patient had a PCN reaction that required hospitalization: no Has patient had a PCN reaction occurring within the last 10 years: no If all of the above answers are "NO", then may proceed with Cephalosporin use.   Marland Kitchen Amoxicillin Rash    Medications Prior to Admission  Medication Sig Dispense Refill Last Dose  . acetaminophen (TYLENOL) 500 MG tablet Take 500 mg by mouth every 8 (eight) hours as needed.   Taking  . docusate sodium (COLACE) 100 MG capsule Take 1 capsule (100 mg total) by mouth 2 (two) times daily as needed for mild constipation or moderate constipation. (Patient not taking: Reported on 12/17/2017) 30 capsule 2 Not Taking  . doxylamine, Sleep, (UNISOM) 25 MG tablet Take 12.5 mg by mouth at bedtime.   Taking  . folic acid (FOLVITE) 1 MG tablet Take 1 tablet (1 mg total) by mouth daily. 30 tablet 10 Taking  . ibuprofen (ADVIL,MOTRIN) 600 MG tablet Take 1 tablet (  600 mg total) by mouth every 6 (six) hours as needed for headache, mild pain, moderate pain or cramping. (Patient not taking: Reported on 12/17/2017) 60 tablet 2 Not Taking  . nitrofurantoin, macrocrystal-monohydrate, (MACROBID) 100 MG capsule Take 1 capsule (100 mg total) by mouth 2 (two) times daily. 14 capsule 1   . progesterone (PROMETRIUM) 200 MG capsule Take 1 capsule (200 mg total) by mouth daily. 30 capsule 3 Taking  . terconazole (TERAZOL 3) 0.8 % vaginal cream Place 1 applicator vaginally at bedtime. Apply nightly for three nights. 20 g 0   . traMADol (ULTRAM) 50 MG tablet Take 1-2 tablets (50-100 mg total) by mouth every 6 (six) hours as needed for severe pain. (Patient not taking: Reported on 12/17/2017) 20 tablet 0 Not Taking   Results for orders placed or performed during the hospital encounter of 07/18/18 (from the past 48 hour(s))  Urinalysis, Routine w reflex microscopic      Status: Abnormal   Collection Time: 07/18/18  3:35 PM  Result Value Ref Range   Color, Urine AMBER (A) YELLOW    Comment: BIOCHEMICALS MAY BE AFFECTED BY COLOR   APPearance CLEAR CLEAR   Specific Gravity, Urine 1.027 1.005 - 1.030   pH 5.0 5.0 - 8.0   Glucose, UA NEGATIVE NEGATIVE mg/dL   Hgb urine dipstick NEGATIVE NEGATIVE   Bilirubin Urine NEGATIVE NEGATIVE   Ketones, ur 80 (A) NEGATIVE mg/dL   Protein, ur 30 (A) NEGATIVE mg/dL   Nitrite NEGATIVE NEGATIVE   Leukocytes,Ua NEGATIVE NEGATIVE   RBC / HPF 0-5 0 - 5 RBC/hpf   WBC, UA 6-10 0 - 5 WBC/hpf   Bacteria, UA RARE (A) NONE SEEN   Squamous Epithelial / LPF 0-5 0 - 5   Mucus PRESENT    Hyaline Casts, UA PRESENT     Comment: Performed at [facility], [address] [place], [place] [address]  Comprehensive metabolic panel     Status: Abnormal   Collection Time: 07/18/18  5:10 PM  Result Value Ref Range   Sodium 136 135 - 145 mmol/L   Potassium 3.8 3.5 - 5.1 mmol/L   Chloride 105 98 - 111 mmol/L   CO2 22 22 - 32 mmol/L   Glucose, Bld 89 70 - 99 mg/dL   BUN 5 (L) 6 - 20 mg/dL   Creatinine, Ser 2.44 0.44 - 1.00 mg/dL   Calcium 8.4 (L) 8.9 - 10.3 mg/dL   Total Protein 6.1 (L) 6.5 - 8.1 g/dL   Albumin 2.8 (L) 3.5 - 5.0 g/dL   AST 21 15 - 41 U/L   ALT 18 0 - 44 U/L   Alkaline Phosphatase 47 38 - 126 U/L   Total Bilirubin 0.4 0.3 - 1.2 mg/dL   GFR calc non Af Amer >60 >60 mL/min   GFR calc Af Amer >60 >60 mL/min   Anion gap 9 5 - 15    Comment: Performed at [facility], [address] [place], [place] [address]    Review of Systems  Constitutional: Negative for fever.  Respiratory: Negative for shortness of breath.   Gastrointestinal: Positive for diarrhea, nausea and vomiting.  Genitourinary: Negative for vaginal bleeding.   Physical Exam   Blood pressure (!) 99/55, pulse 72, temperature 98 F (36.7 C), temperature source Oral, resp. rate 16, height  (1.549 m), weight 54.7 kg, last menstrual  period 03/23/2018, SpO2 99 %, unknown if currently breastfeeding.  Physical Exam  Constitutional: She is oriented  to person, place, and time. She appears well-developed and well-nourished. No distress.  HENT:  Head: Normocephalic.  Eyes: Pupils are equal, round, and reactive to light.  GI: Soft. She exhibits no distension and no mass. There is no abdominal tenderness. There is no rebound and no guarding.  Musculoskeletal: Normal range of motion.  Neurological: She is alert and oriented to person, place, and time.  Skin: Skin is warm. She is not diaphoretic.  Psychiatric: Her behavior is normal.   MAU Course  Procedures  None  MDM  Patient has no pain 0/10 CMP: K+ WNL UA shows 80+ ketones LR bolus X 2 liters. Patient up to the bathroom and voiding Pepcid 20 mg IV & Zofran 8 mg IV Patient complaining of tylenol for headache: 1 gram PO tylenol given PO  + fetal heart tones via doppler   Assessment and Plan   A:  1. Gastroenteritis   2. Nausea and vomiting, intractability of vomiting not specified, unspecified vomiting type   3. [redacted] weeks gestation of pregnancy     P:  Discharge home in stable condition BRAT diet  Wash hands well Rx: Zofran Return to MAU if symptoms worsen  Ronnika Collett, Harolyn RutherfordJennifer I, NP 07/18/2018 8:18 PM

## 2018-07-18 NOTE — Progress Notes (Signed)
Patient states her nausea is better and is now resting.

## 2018-08-03 ENCOUNTER — Other Ambulatory Visit: Payer: Self-pay

## 2018-08-03 ENCOUNTER — Ambulatory Visit (HOSPITAL_COMMUNITY)
Admission: RE | Admit: 2018-08-03 | Discharge: 2018-08-03 | Disposition: A | Discharge status: Home / Self Care | Payer: Medicaid Other | Source: Ambulatory Visit | Attending: Obstetrics and Gynecology | Admitting: Obstetrics and Gynecology

## 2018-08-03 DIAGNOSIS — Z3A19 19 weeks gestation of pregnancy: Secondary | ICD-10-CM | POA: Diagnosis not present

## 2018-08-03 DIAGNOSIS — Z3689 Encounter for other specified antenatal screening: Secondary | ICD-10-CM

## 2018-08-03 DIAGNOSIS — Z363 Encounter for antenatal screening for malformations: Secondary | ICD-10-CM | POA: Diagnosis not present

## 2018-08-03 DIAGNOSIS — Z3480 Encounter for supervision of other normal pregnancy, unspecified trimester: Secondary | ICD-10-CM

## 2018-08-03 IMAGING — US US MFM OB COMP + 14 WK
1 series · 14 of 28 positions shown · non-contrast
Comparison: none

[Series 1: us mfm ob comp + 14 wk · 14 of 72 slices shown]
[im 3/72]
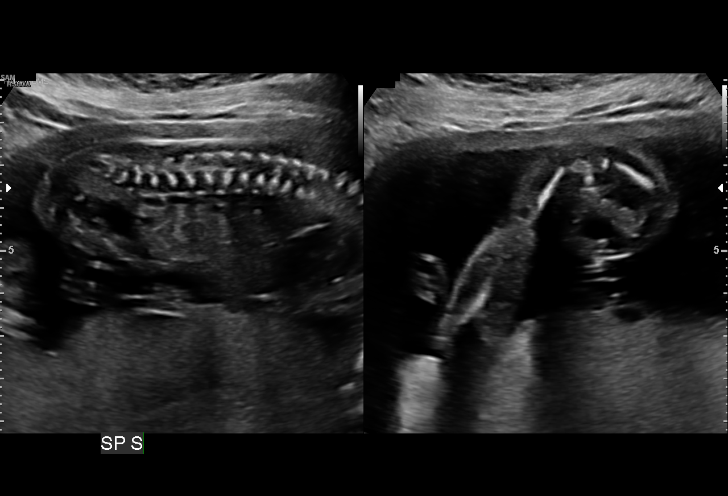
[im 8/72]
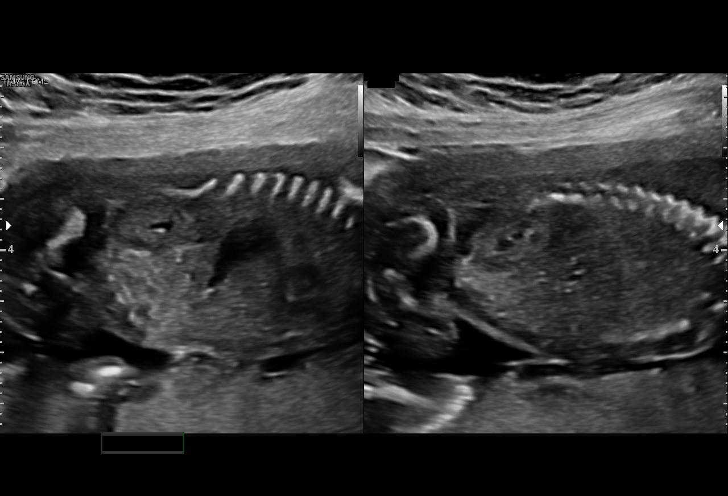
[im 14/72]
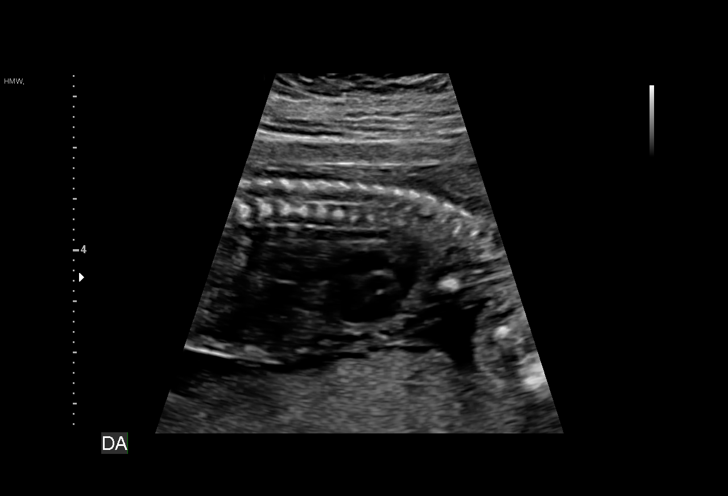
[im 19/72]
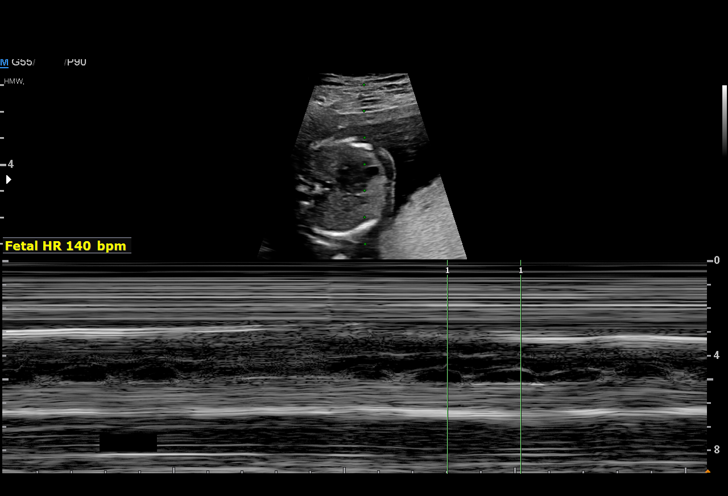
[im 24/72]
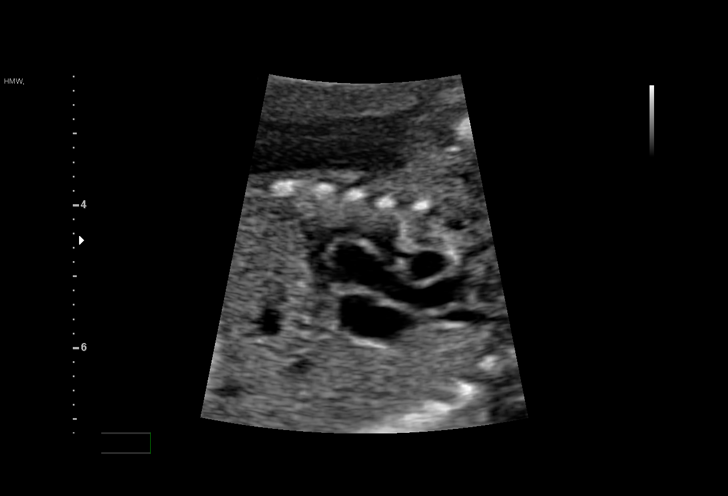
[im 29/72]
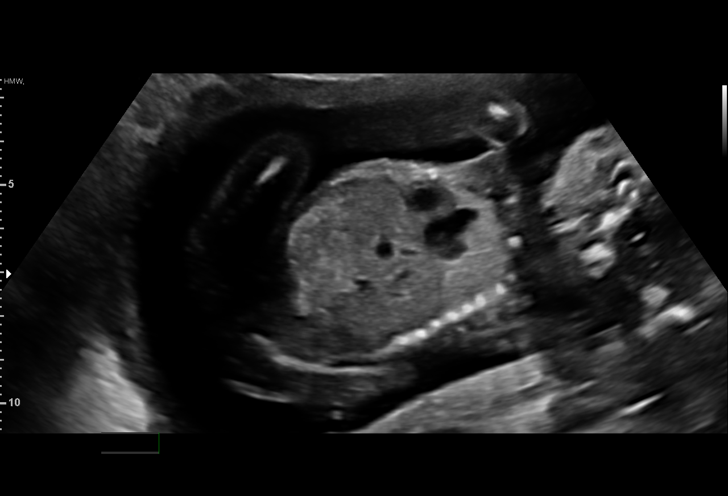
[im 35/72]
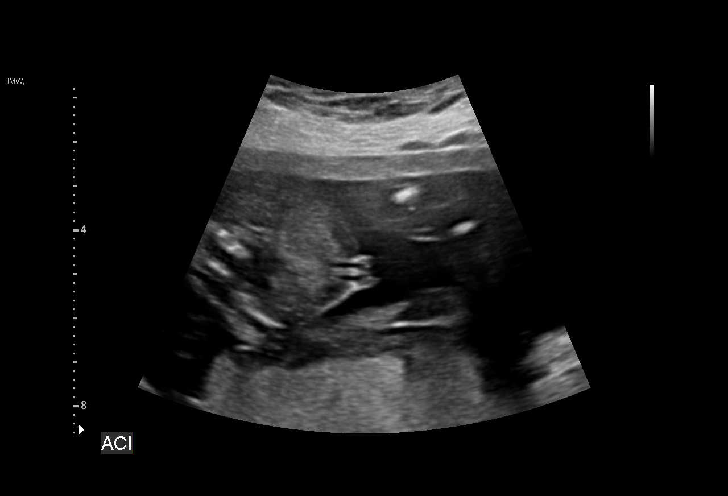
[im 40/72]
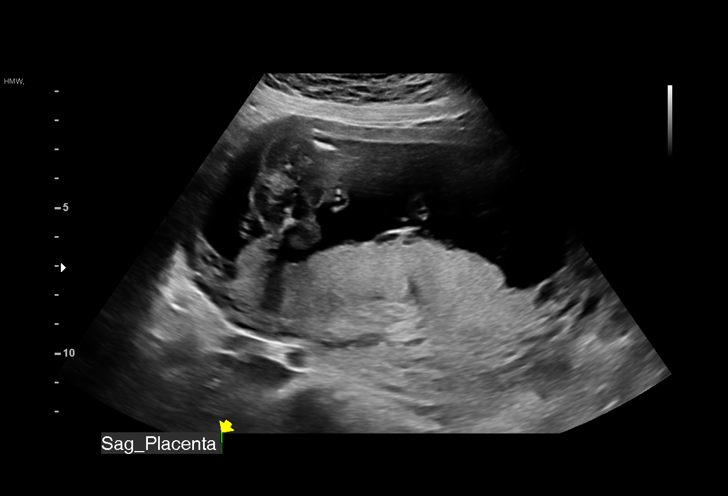
[im 45/72]
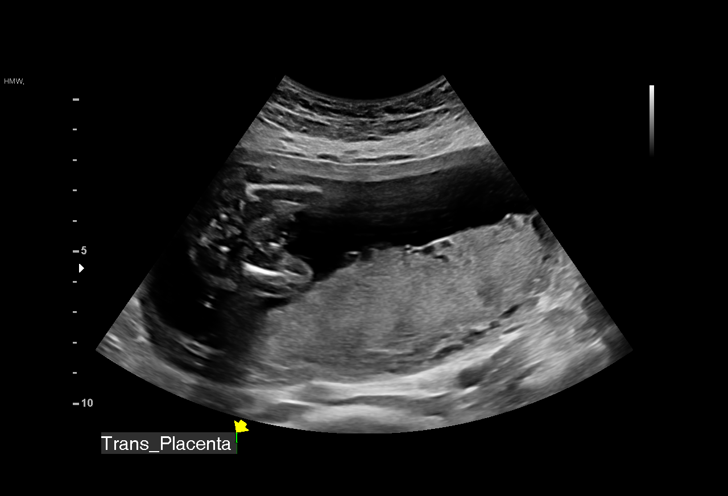
[im 50/72]
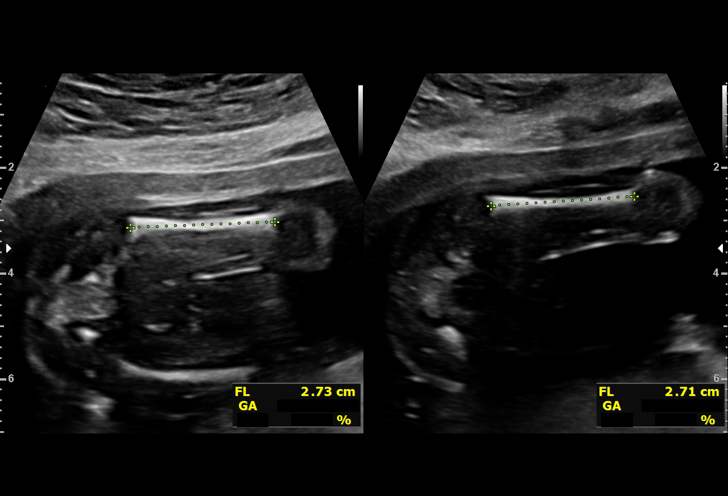
[im 56/72]
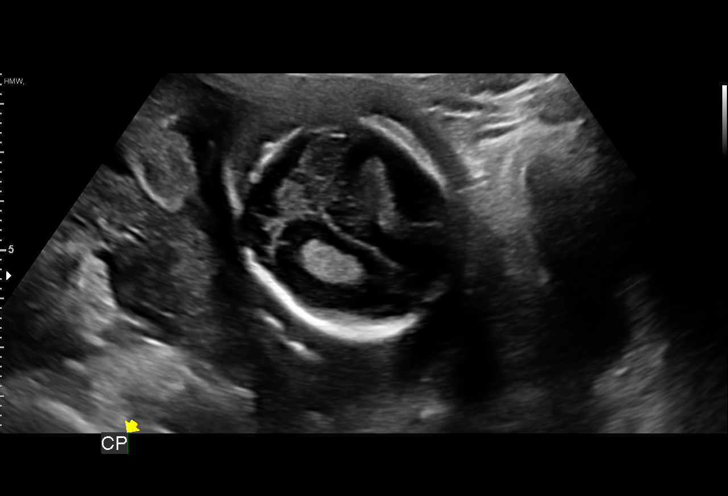
[im 61/72]
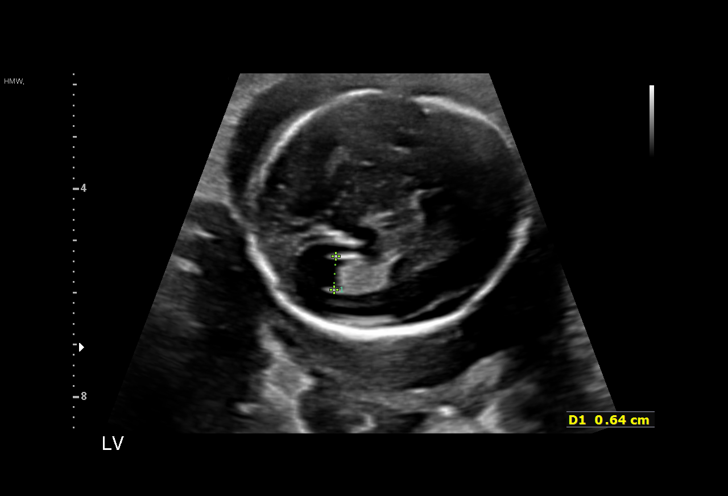
[im 66/72]
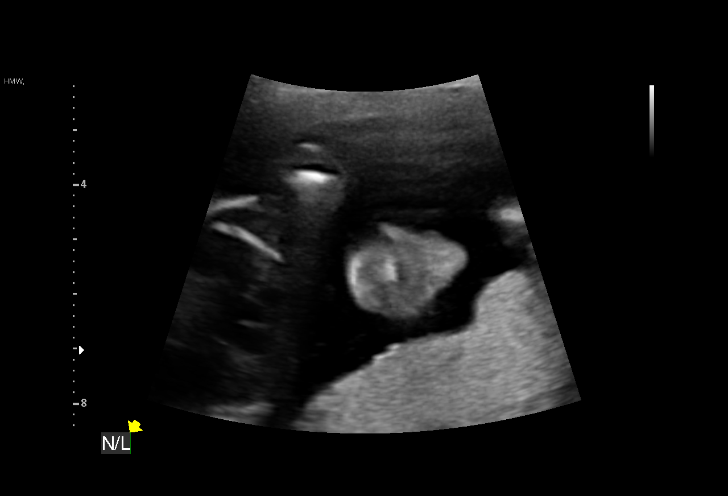
[im 72/72]
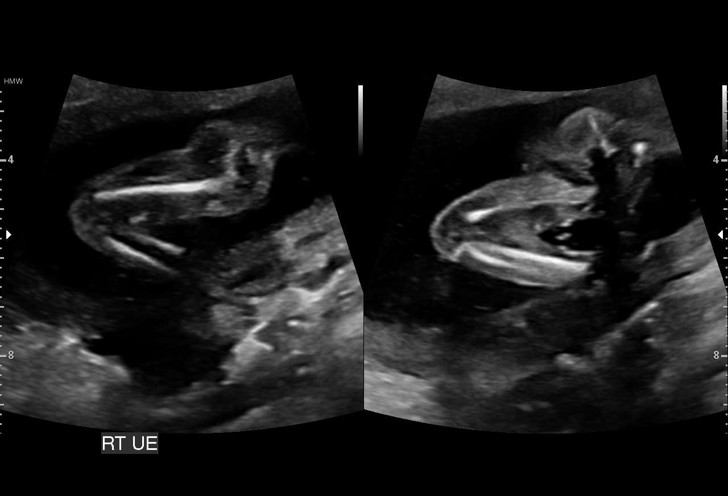

[14 of 28 positions shown; findings below may reference images not displayed]

1  US MFM OB COMP + 14 WK               76805.01     RUSSANO
 ----------------------------------------------------------------------

 ----------------------------------------------------------------------
Indications

  19 weeks gestation of pregnancy
  Encounter for antenatal screening for          [MP]
  malformations (low risk NIPS)
 ----------------------------------------------------------------------
Fetal Evaluation

 Num Of Fetuses:          1
 Cardiac Activity:        Observed
 Presentation:            Cephalic
 Placenta:                Posterior
 P. Cord Insertion:       Visualized, central

 Amniotic Fluid
 AFI FV:      Within normal limits
OB History

 Gravidity:    5         Term:   1        Prem:   0        SAB:   3
 TOP:          0       Ectopic:  0        Living: 1
Gestational Age

 LMP:           19w 0d        Date:  [DATE]                 EDD:   [DATE]
 Best:          19w 0d     Det. By:  LMP  ([DATE])          EDD:   [DATE]
Anatomy

 Cranium:               Appears normal         Aortic Arch:            Appears normal
 Cavum:                 Appears normal         Ductal Arch:            Appears normal
 Ventricles:            Appears normal         Diaphragm:              Appears normal
 Choroid Plexus:        Appears normal         Stomach:                Appears normal, left
                                                                       sided
 Cerebellum:            Appears normal         Abdomen:                Appears normal
 Posterior Fossa:       Appears normal         Abdominal Wall:         Appears nml (cord
                                                                       insert, abd wall)
 Nuchal Fold:           Appears normal         Cord Vessels:           Appears normal (3
                                                                       vessel cord)
 Face:                  Appears normal         Kidneys:                Appear normal
                        (orbits and profile)
 Lips:                  Appears normal         Bladder:                Appears normal
 Thoracic:              Appears normal         Spine:                  Appears normal
 Heart:                 Appears normal         Upper Extremities:      Appears normal
                        (4CH, axis, and
                        situs)
 RVOT:                  Appears normal         Lower Extremities:      Appears normal
 LVOT:                  Appears normal

 Other:  Heels visualized. Nasal bone visualized. Fetus appears to be a male.
Cervix Uterus Adnexa

 Cervix
 Length:            3.4  cm.
 Normal appearance by transabdominal scan.

 Uterus
 No abnormality visualized.

 Left Ovary
 No adnexal mass visualized.

 Right Ovary
 No adnexal mass visualized.

 Cul De Sac
 No free fluid seen.

 Adnexa
 No abnormality visualized.
Impression

 Normal interval growth.  No ultrasonic evidence of structural
 fetal anomalies.
 Normal cervical length
Recommendations

 Follow up as clinically indicated.

## 2018-08-06 ENCOUNTER — Encounter: Payer: Medicaid Other | Admitting: Obstetrics & Gynecology

## 2018-08-06 ENCOUNTER — Telehealth: Payer: Medicaid Other | Admitting: Obstetrics & Gynecology

## 2018-08-06 ENCOUNTER — Telehealth: Payer: Self-pay | Admitting: Radiology

## 2018-08-06 NOTE — Telephone Encounter (Signed)
Called patient to start virtual visit, patient did not answer phone, left message to call the office.

## 2018-09-21 ENCOUNTER — Other Ambulatory Visit: Payer: Self-pay | Admitting: Obstetrics & Gynecology

## 2018-09-21 DIAGNOSIS — O2621 Pregnancy care for patient with recurrent pregnancy loss, first trimester: Secondary | ICD-10-CM

## 2018-10-15 ENCOUNTER — Other Ambulatory Visit: Payer: Self-pay

## 2018-10-15 ENCOUNTER — Ambulatory Visit (INDEPENDENT_AMBULATORY_CARE_PROVIDER_SITE_OTHER): Payer: Medicaid Other | Admitting: Family Medicine

## 2018-10-15 VITALS — BP 102/64 | HR 89 | Wt 137.2 lb

## 2018-10-15 DIAGNOSIS — O09899 Supervision of other high risk pregnancies, unspecified trimester: Secondary | ICD-10-CM

## 2018-10-15 DIAGNOSIS — Z23 Encounter for immunization: Secondary | ICD-10-CM

## 2018-10-15 DIAGNOSIS — Z283 Underimmunization status: Secondary | ICD-10-CM

## 2018-10-15 DIAGNOSIS — O2623 Pregnancy care for patient with recurrent pregnancy loss, third trimester: Secondary | ICD-10-CM

## 2018-10-15 DIAGNOSIS — O9989 Other specified diseases and conditions complicating pregnancy, childbirth and the puerperium: Secondary | ICD-10-CM

## 2018-10-15 DIAGNOSIS — Z3A29 29 weeks gestation of pregnancy: Secondary | ICD-10-CM

## 2018-10-15 DIAGNOSIS — Z3480 Encounter for supervision of other normal pregnancy, unspecified trimester: Secondary | ICD-10-CM

## 2018-10-15 DIAGNOSIS — O2341 Unspecified infection of urinary tract in pregnancy, first trimester: Secondary | ICD-10-CM

## 2018-10-15 NOTE — Progress Notes (Signed)
   PRENATAL VISIT NOTE  Subjective:  Morgan Beltran is a 23 y.o. [phone] at 56w3dbeing seen today for ongoing prenatal care.  She is currently monitored for the following issues for this low-risk pregnancy and has Asthma; Rubella non-immune status, antepartum; Pregnancy complicated by previous recurrent miscarriages x 3; Threatened abortion affecting intrauterine pregnancy; Supervision of other normal pregnancy, antepartum; and UTI (urinary tract infection) during pregnancy, first trimester on their problem list.  Patient reports no complaints.  Contractions: Not present. Vag. Bleeding: None.  Movement: Present. Denies leaking of fluid.   The following portions of the patient's history were reviewed and updated as appropriate: allergies, current medications, past family history, past medical history, past social history, past surgical history and problem list.   Objective:   Vitals:   10/15/18 0837  BP: 102/64  Pulse: 89  Weight: 137 lb 3.2 oz (62.2 kg)    Fetal Status: Fetal Heart Rate (bpm): 135   Movement: Present     General:  Alert, oriented and cooperative. Patient is in no acute distress.  Skin: Skin is warm and dry. No rash noted.   Cardiovascular: Normal heart rate noted  Respiratory: Normal respiratory effort, no problems with respiration noted  Abdomen: Soft, gravid, appropriate for gestational age.  Pain/Pressure: Present     Pelvic: Cervical exam deferred        Extremities: Normal range of motion.  Edema: Trace  Mental Status: Normal mood and affect. Normal behavior. Normal judgment and thought content.   Assessment and Plan:  Pregnancy: [id number] 232w3d. Supervision of other normal pregnancy, antepartum Up to date, getting 3rd trimester labs today - Glucose Tolerance, 2 Hours w/1 Hour - HIV Antibody (routine testing w rflx) - RPR - CBC - Tdap vaccine greater than or equal to 7yo IM  2. Pregnancy complicated by previous recurrent miscarriages in third  trimester  3. Rubella non-immune status, antepartum Needs MMR pp  4. UTI (urinary tract infection) during pregnancy, first trimester TOC today  Preterm labor symptoms and general obstetric precautions including but not limited to vaginal bleeding, contractions, leaking of fluid and fetal movement were reviewed in detail with the patient. Please refer to After Visit Summary for other counseling recommendations.   Return in about 2 weeks (around 10/29/2018) for Telehealth/Virtual health OB Visit, Routine prenatal care.  Future Appointments  Date Time Provider [place]9/11/2018  2:45 PM PrDonnamae JudeMD [facility]    KiCaren MacadamMD

## 2018-10-15 NOTE — Progress Notes (Signed)
Pt presents for ROB. Pt has no concerns today. Pt agrees to get TDAP vaccine today.

## 2018-10-16 LAB — CBC
Hematocrit: 31.8 % — ABNORMAL LOW (ref 34.0–46.6)
Hemoglobin: 10.9 g/dL — ABNORMAL LOW (ref 11.1–15.9)
MCH: 31.6 pg (ref 26.6–33.0)
MCHC: 34.3 g/dL (ref 31.5–35.7)
MCV: 92 fL (ref 79–97)
Platelets: 217 10*3/uL (ref 150–450)
RBC: 3.45 x10E6/uL — ABNORMAL LOW (ref 3.77–5.28)
RDW: 11.8 % (ref 11.7–15.4)
WBC: 8.3 10*3/uL (ref 3.4–10.8)

## 2018-10-16 LAB — GLUCOSE TOLERANCE, 2 HOURS W/ 1HR
Glucose, 1 hour: 77 mg/dL (ref 65–179)
Glucose, 2 hour: 80 mg/dL (ref 65–152)
Glucose, Fasting: 78 mg/dL (ref 65–91)

## 2018-10-16 LAB — HIV ANTIBODY (ROUTINE TESTING W REFLEX): HIV Screen 4th Generation wRfx: NONREACTIVE

## 2018-10-16 LAB — RPR: RPR Ser Ql: NONREACTIVE

## 2018-10-17 LAB — CULTURE, OB URINE

## 2018-10-17 LAB — URINE CULTURE, OB REFLEX

## 2018-10-19 ENCOUNTER — Telehealth: Payer: Self-pay

## 2018-10-19 NOTE — Telephone Encounter (Signed)
Call patient but there was no answer or voice mail to leave a message.

## 2018-10-19 NOTE — Telephone Encounter (Signed)
-----   Message from Allena Earing, NT sent at 10/19/2018  2:50 PM EDT ----- Regarding: please call patient Please call patient for advice

## 2018-10-21 ENCOUNTER — Other Ambulatory Visit: Payer: Self-pay | Admitting: *Deleted

## 2018-10-21 MED ORDER — ONDANSETRON 8 MG PO TBDP: 8.0000 mg | ORAL_TABLET | Freq: Three times a day (TID) | ORAL | 0 refills | Status: DC | PRN

## 2018-10-29 ENCOUNTER — Other Ambulatory Visit: Payer: Self-pay

## 2018-10-29 ENCOUNTER — Telehealth (INDEPENDENT_AMBULATORY_CARE_PROVIDER_SITE_OTHER): Payer: Medicaid Other | Admitting: Family Medicine

## 2018-10-29 DIAGNOSIS — L299 Pruritus, unspecified: Secondary | ICD-10-CM

## 2018-10-29 DIAGNOSIS — R1011 Right upper quadrant pain: Secondary | ICD-10-CM

## 2018-10-29 DIAGNOSIS — Z3A31 31 weeks gestation of pregnancy: Secondary | ICD-10-CM

## 2018-10-29 DIAGNOSIS — O9989 Other specified diseases and conditions complicating pregnancy, childbirth and the puerperium: Secondary | ICD-10-CM

## 2018-10-29 DIAGNOSIS — Z3480 Encounter for supervision of other normal pregnancy, unspecified trimester: Secondary | ICD-10-CM

## 2018-10-29 NOTE — Patient Instructions (Signed)

## 2018-10-29 NOTE — Progress Notes (Signed)
Tops of hands and feet started itching this past week

## 2018-10-30 ENCOUNTER — Telehealth: Payer: Self-pay | Admitting: Radiology

## 2018-10-30 NOTE — Telephone Encounter (Signed)
Left message to call [facility], need to schedule [place] and lab visit, also 2 virtual visit.

## 2018-10-30 NOTE — Progress Notes (Signed)
    TELEHEALTH OBSTETRICS PRENATAL VIRTUAL VIDEO VISIT ENCOUNTER NOTE  Provider location: [facility] [place] [facility]   I connected with Phylliss Blakes on 10/30/18 at  2:45 PM EDT by MyChart Video Encounter at home and verified that I am speaking with the correct person using two identifiers.   I discussed the limitations, risks, security and privacy concerns of performing an evaluation and management service virtually and the availability of in person appointments. I also discussed with the patient that there may be a patient responsible charge related to this service. The patient expressed understanding and agreed to proceed. Subjective:  Morgan Beltran is a 23 y.o. [phone] at [redacted]w[redacted]d being seen today for ongoing prenatal care.  She is currently monitored for the following issues for this low-risk pregnancy and has Asthma; Rubella non-immune status, antepartum; Pregnancy complicated by previous recurrent miscarriages x 3; Threatened abortion affecting intrauterine pregnancy; Supervision of other normal pregnancy, antepartum; and UTI (urinary tract infection) during pregnancy, first trimester on their problem list.  Patient reports itching on palms and soles.  Contractions: Not present. Vag. Bleeding: None.  Movement: Present. Denies any leaking of fluid.   The following portions of the patient's history were reviewed and updated as appropriate: allergies, current medications, past family history, past medical history, past social history, past surgical history and problem list.   Objective:  There were no vitals filed for this visit.  Fetal Status:     Movement: Present     General:  Alert, oriented and cooperative. Patient is in no acute distress.  Respiratory: Normal respiratory effort, no problems with respiration noted  Mental Status: Normal mood and affect. Normal behavior. Normal judgment and thought content.  Rest of physical exam deferred due to type of encounter   Imaging: No results found.  Assessment and Plan:  Pregnancy: [id number] at [redacted]w[redacted]d 1. Pruritic condition Check bile acids - Bile acids, total; Future  2. RUQ pain Also with severe RUQ pain after eating fatty foods--will check u/s - US ABDOMEN LIMITED RUQ; Future  3. Supervision of other normal pregnancy, antepartum Continue routine prenatal care.   Preterm labor symptoms and general obstetric precautions including but not limited to vaginal bleeding, contractions, leaking of fluid and fetal movement were reviewed in detail with the patient. I discussed the assessment and treatment plan with the patient. The patient was provided an opportunity to ask questions and all were answered. The patient agreed with the plan and demonstrated an understanding of the instructions. The patient was advised to call back or seek an in-person office evaluation/go to MAU at [facility] for any urgent or concerning symptoms. Please refer to After Visit Summary for other counseling recommendations.   I provided 11 minutes of face-to-face time during this encounter.  Return in 2 weeks (on 11/12/2018).  No future appointments.  Donnamae Jude, MD [facility], [place]

## 2018-11-05 ENCOUNTER — Other Ambulatory Visit: Payer: Self-pay

## 2018-11-05 ENCOUNTER — Other Ambulatory Visit: Payer: Medicaid Other

## 2018-11-05 ENCOUNTER — Ambulatory Visit (HOSPITAL_COMMUNITY)
Admission: RE | Admit: 2018-11-05 | Discharge: 2018-11-05 | Disposition: A | Discharge status: Home / Self Care | Payer: Medicaid Other | Source: Ambulatory Visit | Attending: Family Medicine | Admitting: Family Medicine

## 2018-11-05 DIAGNOSIS — L299 Pruritus, unspecified: Secondary | ICD-10-CM

## 2018-11-05 DIAGNOSIS — R1011 Right upper quadrant pain: Secondary | ICD-10-CM | POA: Diagnosis not present

## 2018-11-05 IMAGING — US US ABDOMEN LIMITED
1 series · 14 of 25 positions shown · non-contrast
Comparison: None.

CLINICAL DATA: Right upper quadrant pain

EXAM:
ULTRASOUND ABDOMEN LIMITED RIGHT UPPER QUADRANT

[Series 1: us abdomen limited · 14 of 50 slices shown]
[im 1/50]
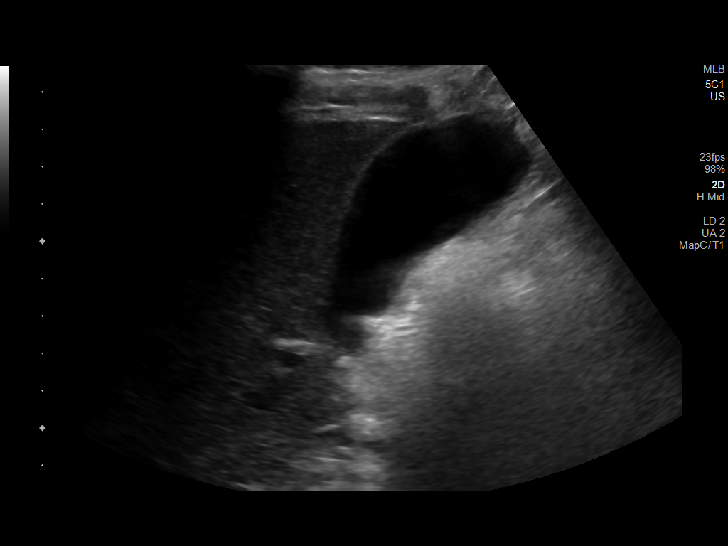
[im 5/50]
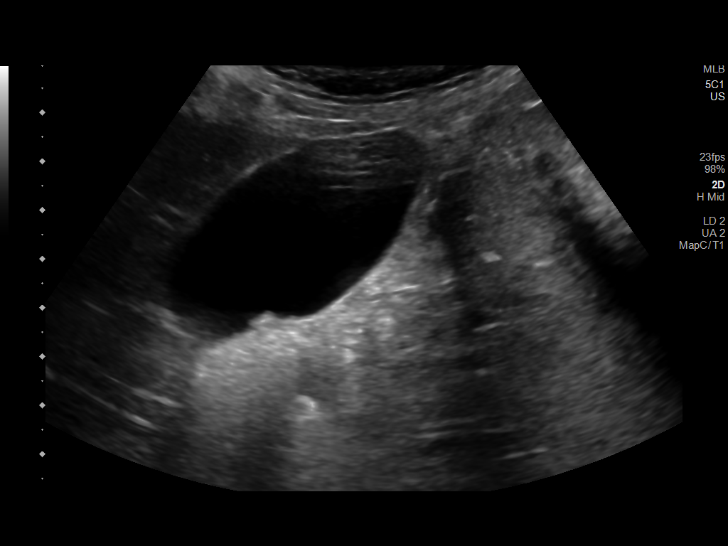
[im 9/50]
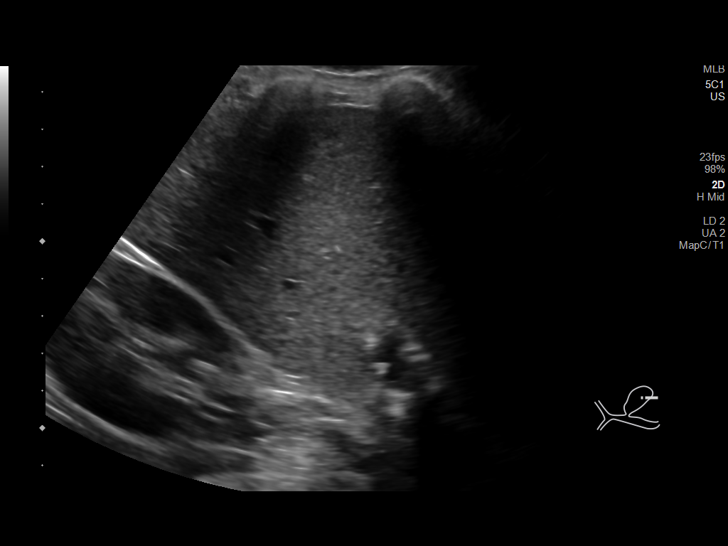
[im 13/50]
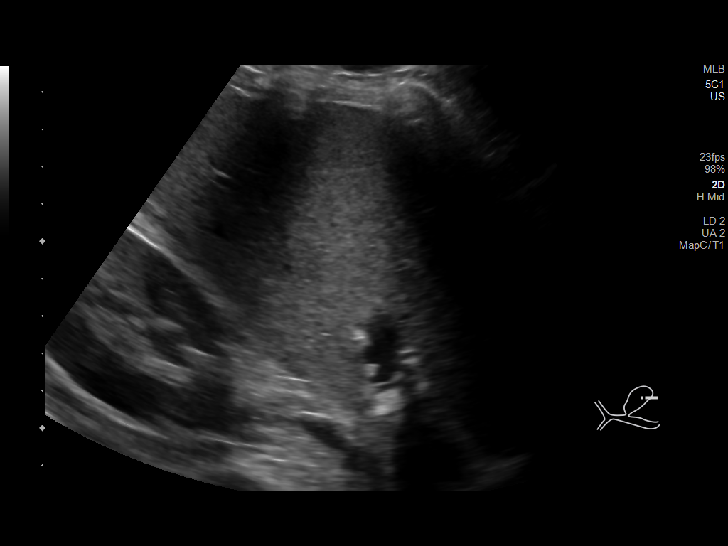
[im 17/50]
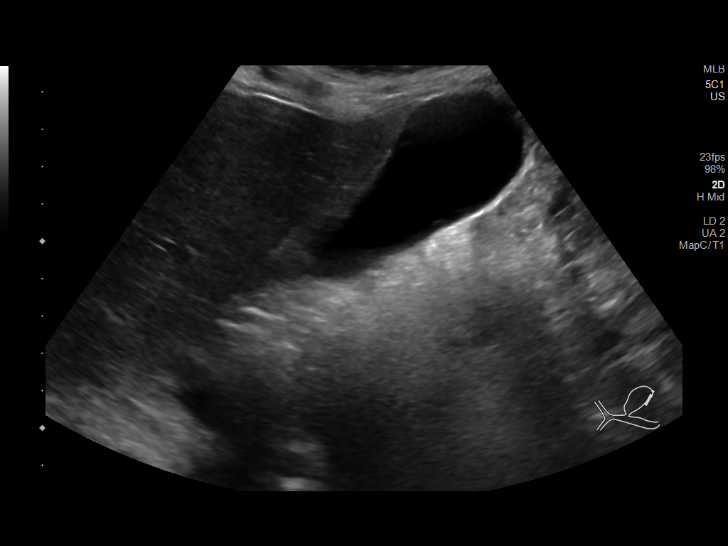
[im 19/50]
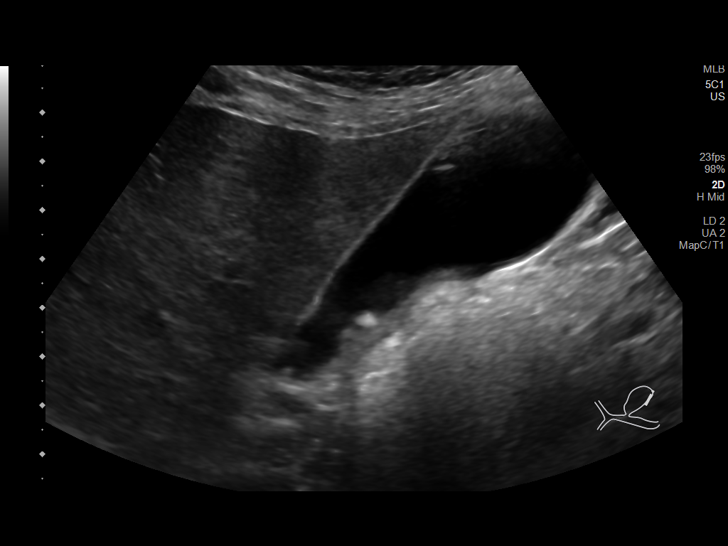
[im 23/50]
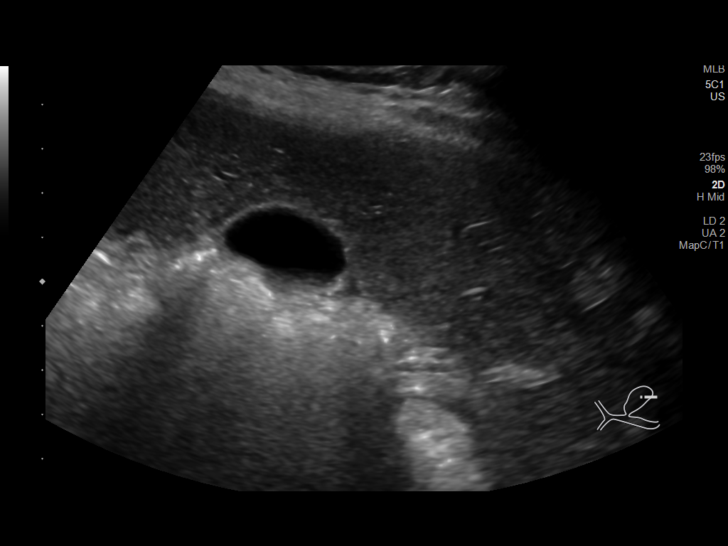
[im 27/50]
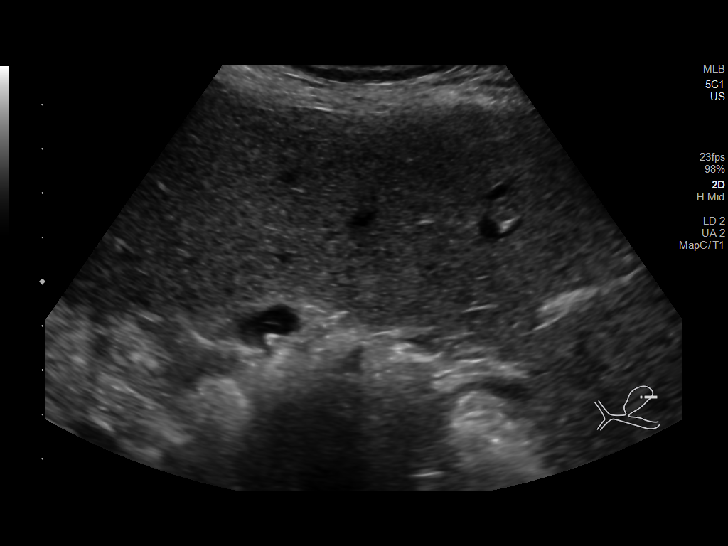
[im 31/50]
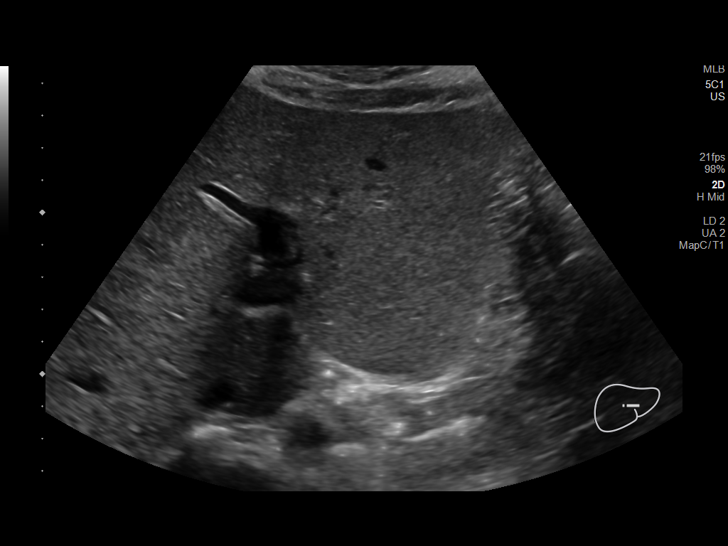
[im 33/50]
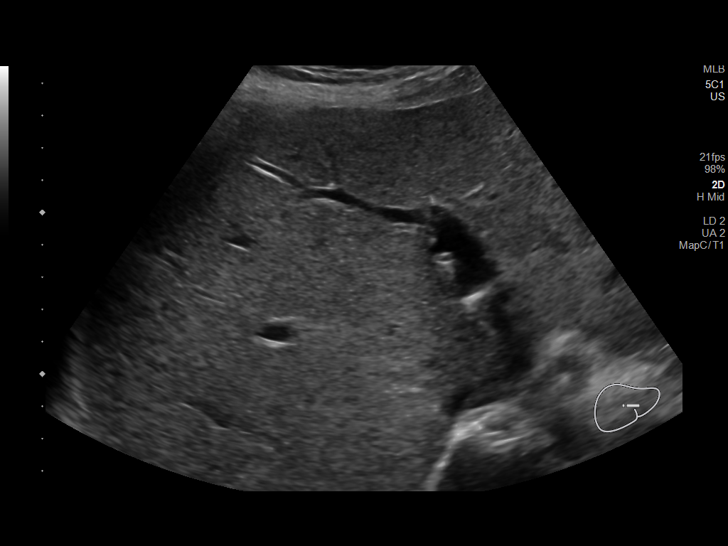
[im 37/50]
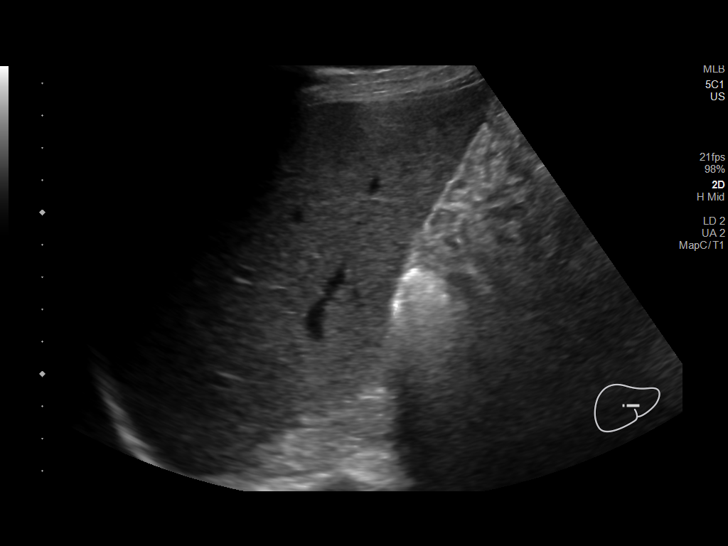
[im 41/50]
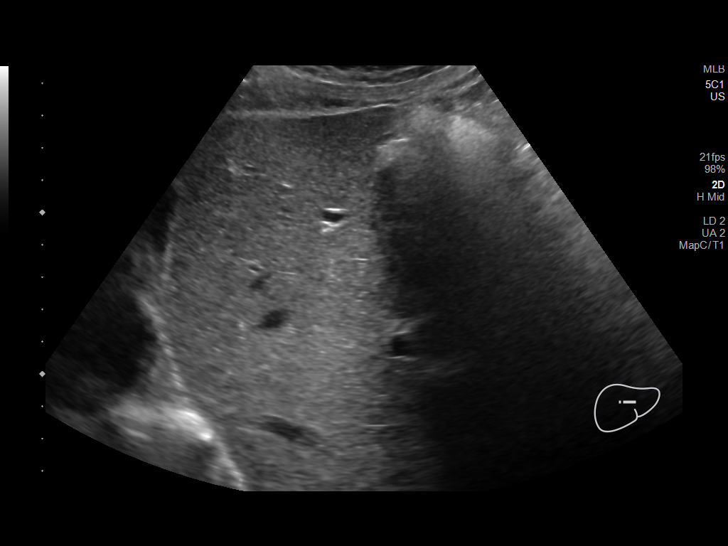
[im 45/50]
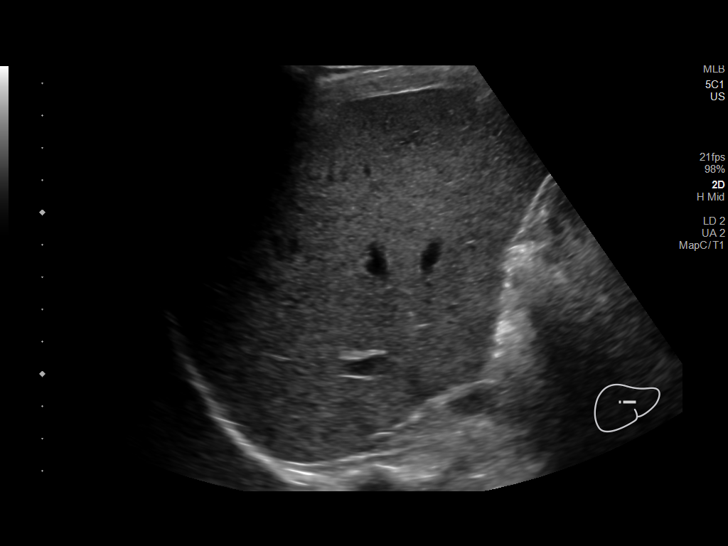
[im 50/50]
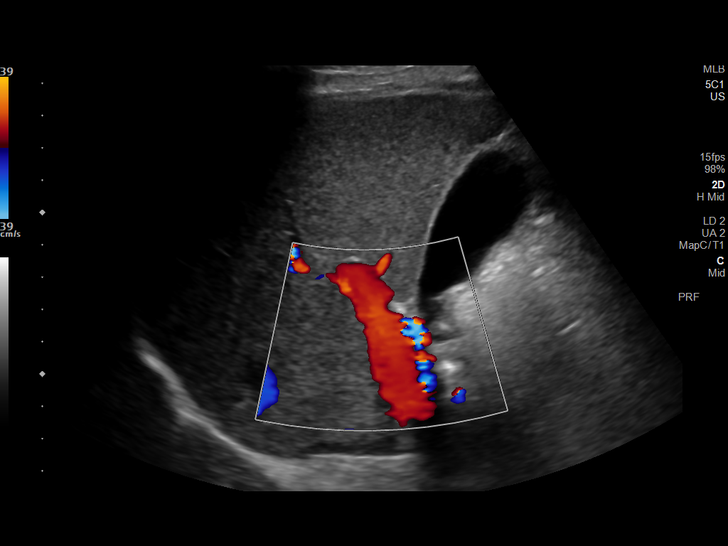

[14 of 25 positions shown; findings below may reference images not displayed]

FINDINGS: Gallbladder:

Within the gallbladder, there are echogenic foci which move and
shadow consistent with cholelithiasis. Largest gallstone measures 5
mm in length. In addition there is a 2 mm nonmobile structure felt
to represent a small polyp. There is no gallbladder wall thickening
or pericholecystic fluid. No sonographic Murphy sign noted by
sonographer.

Common bile duct:

Diameter: 3 mm. No intrahepatic or extrahepatic biliary duct
dilatation.

Liver:

No focal lesion identified. Within normal limits in parenchymal
echogenicity. Portal vein is patent on color Doppler imaging with
normal direction of blood flow towards the liver.

Other: None.
IMPRESSION: Cholelithiasis. No gallbladder wall thickening or pericholecystic
fluid. There is also a 2 mm polyp in the gallbladder, a finding
which does not warrant additional surveillance based on size per
consensus guidelines.

Study otherwise unremarkable.

## 2018-11-06 LAB — BILE ACIDS, TOTAL: Bile Acids Total: 4.3 umol/L (ref 0.0–10.0)

## 2018-11-10 ENCOUNTER — Telehealth: Payer: Medicaid Other | Admitting: Obstetrics & Gynecology

## 2018-11-10 ENCOUNTER — Telehealth: Payer: Self-pay | Admitting: Radiology

## 2018-11-10 NOTE — Telephone Encounter (Signed)
Left message to call [facility] for mychart visit with Dr Harolyn Rutherford. Patient did not answer when called.

## 2018-12-07 ENCOUNTER — Other Ambulatory Visit: Payer: Self-pay

## 2018-12-07 ENCOUNTER — Ambulatory Visit (INDEPENDENT_AMBULATORY_CARE_PROVIDER_SITE_OTHER): Payer: Medicaid Other | Admitting: Obstetrics and Gynecology

## 2018-12-07 ENCOUNTER — Encounter: Payer: Self-pay | Admitting: Obstetrics and Gynecology

## 2018-12-07 ENCOUNTER — Other Ambulatory Visit (HOSPITAL_COMMUNITY)
Admission: RE | Admit: 2018-12-07 | Discharge: 2018-12-07 | Disposition: A | Discharge status: Home / Self Care | Payer: Medicaid Other | Source: Ambulatory Visit | Attending: Obstetrics and Gynecology | Admitting: Obstetrics and Gynecology

## 2018-12-07 VITALS — BP 96/66 | HR 86 | Wt 146.0 lb

## 2018-12-07 DIAGNOSIS — O2343 Unspecified infection of urinary tract in pregnancy, third trimester: Secondary | ICD-10-CM

## 2018-12-07 DIAGNOSIS — O99891 Other specified diseases and conditions complicating pregnancy: Secondary | ICD-10-CM

## 2018-12-07 DIAGNOSIS — O2341 Unspecified infection of urinary tract in pregnancy, first trimester: Secondary | ICD-10-CM

## 2018-12-07 DIAGNOSIS — Z3480 Encounter for supervision of other normal pregnancy, unspecified trimester: Secondary | ICD-10-CM | POA: Diagnosis present

## 2018-12-07 DIAGNOSIS — Z283 Underimmunization status: Secondary | ICD-10-CM

## 2018-12-07 DIAGNOSIS — Z2839 Other underimmunization status: Secondary | ICD-10-CM

## 2018-12-07 DIAGNOSIS — Z3A37 37 weeks gestation of pregnancy: Secondary | ICD-10-CM

## 2018-12-07 NOTE — Progress Notes (Signed)
   PRENATAL VISIT NOTE  Subjective:  Morgan Beltran is a 23 y.o. [phone] at 19w0dbeing seen today for ongoing prenatal care.  She is currently monitored for the following issues for this low-risk pregnancy and has Asthma; Rubella non-immune status, antepartum; Pregnancy complicated by previous recurrent miscarriages x 3; Threatened abortion affecting intrauterine pregnancy; Supervision of other normal pregnancy, antepartum; and UTI (urinary tract infection) during pregnancy, first trimester on their problem list.  Patient reports inability to sleep, having a hard time getting to sleep.  Contractions: Irregular. Vag. Bleeding: None.  Movement: Present. Denies leaking of fluid.   The following portions of the patient's history were reviewed and updated as appropriate: allergies, current medications, past family history, past medical history, past social history, past surgical history and problem list.   Objective:   Vitals:   12/07/18 1602  BP: 96/66  Pulse: 86  Weight: 146 lb (66.2 kg)    Fetal Status: Fetal Heart Rate (bpm): 135   Movement: Present     General:  Alert, oriented and cooperative. Patient is in no acute distress.  Skin: Skin is warm and dry. No rash noted.   Cardiovascular: Normal heart rate noted  Respiratory: Normal respiratory effort, no problems with respiration noted  Abdomen: Soft, gravid, appropriate for gestational age.  Pain/Pressure: Present     Pelvic: Cervical exam deferred        Extremities: Normal range of motion.  Edema: None  Mental Status: Normal mood and affect. Normal behavior. Normal judgment and thought content.   Pt informed that the ultrasound is considered a limited OB ultrasound and is not intended to be a complete ultrasound exam.  Patient also informed that the ultrasound is not being completed with the intent of assessing for fetal or placental anomalies or any pelvic abnormalities.  Explained that the purpose of today's ultrasound is to  assess for  presentation.  Patient acknowledges the purpose of the exam and the limitations of the study.    Bedside [place] cephalic  Assessment and Plan:  Pregnancy: [id number] 338w0d1. Supervision of other normal pregnancy, antepartum - Culture, beta strep (group b only) - Cervicovaginal ancillary only( [place])  2. UTI (urinary tract infection) during pregnancy, first trimester  3. Rubella non-immune status, antepartum MMR pp  Term labor symptoms and general obstetric precautions including but not limited to vaginal bleeding, contractions, leaking of fluid and fetal movement were reviewed in detail with the patient. Please refer to After Visit Summary for other counseling recommendations.   Return in about 1 week (around 12/14/2018) for low OB, in person.  No future appointments.  KeSloan LeiterMD

## 2018-12-10 LAB — CERVICOVAGINAL ANCILLARY ONLY
Chlamydia: NEGATIVE
Comment: NEGATIVE
Comment: NORMAL
Neisseria Gonorrhea: NEGATIVE

## 2018-12-11 ENCOUNTER — Telehealth: Payer: Self-pay | Admitting: *Deleted

## 2018-12-11 LAB — CULTURE, BETA STREP (GROUP B ONLY): Strep Gp B Culture: NEGATIVE

## 2018-12-11 MED ORDER — CLINDAMYCIN HCL 300 MG PO CAPS: 300.0000 mg | ORAL_CAPSULE | Freq: Four times a day (QID) | ORAL | 0 refills | Status: AC

## 2018-12-11 MED ORDER — ONDANSETRON 8 MG PO TBDP: 8.0000 mg | ORAL_TABLET | Freq: Three times a day (TID) | ORAL | 0 refills | Status: DC | PRN

## 2018-12-11 NOTE — Telephone Encounter (Signed)
Pt called stating her tooth is hurting again and was treated for it previously and wanted to know if we would refill the antibiotic they gave her at [facility] and refill her zofran. Okay per Dr Harolyn Rutherford to refill both meds.

## 2018-12-16 ENCOUNTER — Ambulatory Visit (INDEPENDENT_AMBULATORY_CARE_PROVIDER_SITE_OTHER): Payer: Medicaid Other | Admitting: Advanced Practice Midwife

## 2018-12-16 ENCOUNTER — Other Ambulatory Visit: Payer: Self-pay

## 2018-12-16 VITALS — BP 96/66 | HR 71 | Wt 150.0 lb

## 2018-12-16 DIAGNOSIS — Z3480 Encounter for supervision of other normal pregnancy, unspecified trimester: Secondary | ICD-10-CM

## 2018-12-16 DIAGNOSIS — Z283 Underimmunization status: Secondary | ICD-10-CM

## 2018-12-16 DIAGNOSIS — Z3A38 38 weeks gestation of pregnancy: Secondary | ICD-10-CM

## 2018-12-16 DIAGNOSIS — O99891 Other specified diseases and conditions complicating pregnancy: Secondary | ICD-10-CM

## 2018-12-16 DIAGNOSIS — O09899 Supervision of other high risk pregnancies, unspecified trimester: Secondary | ICD-10-CM

## 2018-12-16 NOTE — Progress Notes (Signed)
   PRENATAL VISIT NOTE  Subjective:  Morgan Beltran is a 23 y.o. [phone] at 23w2dbeing seen today for ongoing prenatal care.  She is currently monitored for the following issues for this low-risk pregnancy and has Asthma; Rubella non-immune status, antepartum; Pregnancy complicated by previous recurrent miscarriages x 3; Threatened abortion affecting intrauterine pregnancy; Supervision of other normal pregnancy, antepartum; and UTI (urinary tract infection) during pregnancy, first trimester on their problem list.  Patient reports no complaints.  Contractions: Irregular. Vag. Bleeding: None.  Movement: Present. Denies leaking of fluid.   The following portions of the patient's history were reviewed and updated as appropriate: allergies, current medications, past family history, past medical history, past social history, past surgical history and problem list. Problem list updated.  Objective:   Vitals:   12/16/18 1434  BP: 96/66  Pulse: 71  Weight: 150 lb (68 kg)    Fetal Status: Fetal Heart Rate (bpm): 155 Fundal Height: 38 cm Movement: Present  Presentation: Vertex  General:  Alert, oriented and cooperative. Patient is in no acute distress.  Skin: Skin is warm and dry. No rash noted.   Cardiovascular: Normal heart rate noted  Respiratory: Normal respiratory effort, no problems with respiration noted  Abdomen: Soft, gravid, appropriate for gestational age.  Pain/Pressure: Present     Pelvic: Cervical exam performed Dilation: 1 Effacement (%): 60 Station: Ballotable  Extremities: Normal range of motion.  Edema: None  Mental Status: Normal mood and affect. Normal behavior. Normal judgment and thought content.   Assessment and Plan:  Pregnancy: [id number] 355w2d1. Supervision of other normal pregnancy, antepartum - Continue routine care - Discussed EBP supporting membrane sweep at 39 and 40 weeks, discuss next visit - Reviewed milestones for remaining visits, symptoms which  require MAU visit  2. Rubella non-immune status, antepartum - MMR pp  Term labor symptoms and general obstetric precautions including but not limited to vaginal bleeding, contractions, leaking of fluid and fetal movement were reviewed in detail with the patient. Please refer to After Visit Summary for other counseling recommendations.  Return in about 1 week (around 12/23/2018) for in person 39 weeks.  No future appointments.  SaDarlina RumpfCNM

## 2018-12-16 NOTE — Patient Instructions (Signed)

## 2018-12-20 ENCOUNTER — Encounter (HOSPITAL_COMMUNITY): Payer: Self-pay

## 2018-12-20 ENCOUNTER — Inpatient Hospital Stay (HOSPITAL_COMMUNITY): Payer: Medicaid Other | Admitting: Anesthesiology

## 2018-12-20 ENCOUNTER — Inpatient Hospital Stay (HOSPITAL_COMMUNITY)
Admission: AD | Admit: 2018-12-20 | Discharge: 2018-12-22 | DRG: 806 | Disposition: A | Discharge status: Home / Self Care | Payer: Medicaid Other | Attending: Obstetrics & Gynecology | Admitting: Obstetrics & Gynecology

## 2018-12-20 ENCOUNTER — Other Ambulatory Visit: Payer: Self-pay

## 2018-12-20 DIAGNOSIS — O2662 Liver and biliary tract disorders in childbirth: Secondary | ICD-10-CM | POA: Diagnosis present

## 2018-12-20 DIAGNOSIS — Z3A39 39 weeks gestation of pregnancy: Secondary | ICD-10-CM | POA: Diagnosis not present

## 2018-12-20 DIAGNOSIS — Z20828 Contact with and (suspected) exposure to other viral communicable diseases: Secondary | ICD-10-CM | POA: Diagnosis present

## 2018-12-20 DIAGNOSIS — O26613 Liver and biliary tract disorders in pregnancy, third trimester: Secondary | ICD-10-CM

## 2018-12-20 DIAGNOSIS — K8020 Calculus of gallbladder without cholecystitis without obstruction: Secondary | ICD-10-CM

## 2018-12-20 DIAGNOSIS — Z3A38 38 weeks gestation of pregnancy: Secondary | ICD-10-CM

## 2018-12-20 DIAGNOSIS — O4292 Full-term premature rupture of membranes, unspecified as to length of time between rupture and onset of labor: Secondary | ICD-10-CM | POA: Diagnosis present

## 2018-12-20 LAB — CBC
HCT: 31.4 % — ABNORMAL LOW (ref 36.0–46.0)
Hemoglobin: 10.4 g/dL — ABNORMAL LOW (ref 12.0–15.0)
MCH: 29.1 pg (ref 26.0–34.0)
MCHC: 33.1 g/dL (ref 30.0–36.0)
MCV: 87.7 fL (ref 80.0–100.0)
Platelets: 226 10*3/uL (ref 150–400)
RBC: 3.58 MIL/uL — ABNORMAL LOW (ref 3.87–5.11)
RDW: 12.8 % (ref 11.5–15.5)
WBC: 8.6 10*3/uL (ref 4.0–10.5)
nRBC: 0 % (ref 0.0–0.2)

## 2018-12-20 LAB — POCT FERN TEST: POCT Fern Test: POSITIVE

## 2018-12-20 LAB — SARS CORONAVIRUS 2 BY RT PCR (HOSPITAL ORDER, PERFORMED IN ~~LOC~~ HOSPITAL LAB): SARS Coronavirus 2: NEGATIVE

## 2018-12-20 LAB — TYPE AND SCREEN
ABO/RH(D): AB POS
Antibody Screen: NEGATIVE

## 2018-12-20 MED ORDER — ACETAMINOPHEN 325 MG PO TABS
650.0000 mg | ORAL_TABLET | ORAL | Status: DC | PRN
  Administered 2018-12-20: 650 mg via ORAL
  Filled 2018-12-20: qty 2

## 2018-12-20 MED ORDER — LACTATED RINGERS IV SOLN: 500.0000 mL | INTRAVENOUS | Status: DC | PRN

## 2018-12-20 MED ORDER — SOD CITRATE-CITRIC ACID 500-334 MG/5ML PO SOLN: 30.0000 mL | ORAL | Status: DC | PRN

## 2018-12-20 MED ORDER — ONDANSETRON HCL 4 MG/2ML IJ SOLN
4.0000 mg | Freq: Four times a day (QID) | INTRAMUSCULAR | Status: DC | PRN
  Administered 2018-12-20 – 2018-12-21 (×3): 4 mg via INTRAVENOUS
  Filled 2018-12-20 (×3): qty 2

## 2018-12-20 MED ORDER — LIDOCAINE-EPINEPHRINE (PF) 2 %-1:200000 IJ SOLN
INTRAMUSCULAR | Status: DC | PRN
  Administered 2018-12-20 (×2): 2 mL via EPIDURAL

## 2018-12-20 MED ORDER — LACTATED RINGERS IV SOLN
INTRAVENOUS | Status: DC
  Administered 2018-12-20: 13:00:00 via INTRAVENOUS

## 2018-12-20 MED ORDER — LACTATED RINGERS IV SOLN
500.0000 mL | Freq: Once | INTRAVENOUS | Status: AC
  Administered 2018-12-20: 500 mL via INTRAVENOUS

## 2018-12-20 MED ORDER — OXYCODONE-ACETAMINOPHEN 5-325 MG PO TABS: 2.0000 | ORAL_TABLET | ORAL | Status: DC | PRN

## 2018-12-20 MED ORDER — FENTANYL-BUPIVACAINE-NACL 0.5-0.125-0.9 MG/250ML-% EP SOLN
EPIDURAL | Status: AC
  Filled 2018-12-20: qty 250

## 2018-12-20 MED ORDER — PHENYLEPHRINE 40 MCG/ML (10ML) SYRINGE FOR IV PUSH (FOR BLOOD PRESSURE SUPPORT): 80.0000 ug | PREFILLED_SYRINGE | INTRAVENOUS | Status: DC | PRN

## 2018-12-20 MED ORDER — PHENYLEPHRINE 40 MCG/ML (10ML) SYRINGE FOR IV PUSH (FOR BLOOD PRESSURE SUPPORT)
PREFILLED_SYRINGE | INTRAVENOUS | Status: AC
  Filled 2018-12-20: qty 10

## 2018-12-20 MED ORDER — OXYTOCIN BOLUS FROM INFUSION
500.0000 mL | Freq: Once | INTRAVENOUS | Status: AC
  Administered 2018-12-21: 500 mL via INTRAVENOUS

## 2018-12-20 MED ORDER — FENTANYL CITRATE (PF) 100 MCG/2ML IJ SOLN
100.0000 ug | INTRAMUSCULAR | Status: DC | PRN
  Filled 2018-12-20: qty 2

## 2018-12-20 MED ORDER — DIPHENHYDRAMINE HCL 50 MG/ML IJ SOLN: 12.5000 mg | INTRAMUSCULAR | Status: DC | PRN

## 2018-12-20 MED ORDER — LACTATED RINGERS IV SOLN
INTRAVENOUS | Status: DC
  Administered 2018-12-20: 16:00:00 via INTRAVENOUS

## 2018-12-20 MED ORDER — FENTANYL-BUPIVACAINE-NACL 0.5-0.125-0.9 MG/250ML-% EP SOLN: 12.0000 mL/h | EPIDURAL | Status: DC | PRN

## 2018-12-20 MED ORDER — LIDOCAINE HCL (PF) 1 % IJ SOLN: 30.0000 mL | INTRAMUSCULAR | Status: DC | PRN

## 2018-12-20 MED ORDER — EPHEDRINE 5 MG/ML INJ: 10.0000 mg | INTRAVENOUS | Status: DC | PRN

## 2018-12-20 MED ORDER — OXYTOCIN 40 UNITS IN NORMAL SALINE INFUSION - SIMPLE MED
2.5000 [IU]/h | INTRAVENOUS | Status: DC
  Filled 2018-12-20: qty 1000

## 2018-12-20 MED ORDER — TERBUTALINE SULFATE 1 MG/ML IJ SOLN: 0.2500 mg | Freq: Once | INTRAMUSCULAR | Status: DC | PRN

## 2018-12-20 MED ORDER — SODIUM CHLORIDE (PF) 0.9 % IJ SOLN
INTRAMUSCULAR | Status: DC | PRN
  Administered 2018-12-20: 12 mL/h via EPIDURAL

## 2018-12-20 MED ORDER — OXYCODONE-ACETAMINOPHEN 5-325 MG PO TABS: 1.0000 | ORAL_TABLET | ORAL | Status: DC | PRN

## 2018-12-20 MED ORDER — OXYTOCIN 40 UNITS IN NORMAL SALINE INFUSION - SIMPLE MED
1.0000 m[IU]/min | INTRAVENOUS | Status: DC
  Administered 2018-12-20: 2 m[IU]/min via INTRAVENOUS

## 2018-12-20 NOTE — Anesthesia Preprocedure Evaluation (Signed)
Anesthesia Evaluation  Patient identified by MRN, date of birth, ID band Patient awake    Reviewed: Allergy & Precautions, NPO status , Patient's Chart, lab work & pertinent test results  Airway Mallampati: II  TM Distance: >3 FB Neck ROM: Full    Dental no notable dental hx.    Pulmonary asthma ,    Pulmonary exam normal breath sounds clear to auscultation       Cardiovascular negative cardio ROS Normal cardiovascular exam Rhythm:Regular Rate:Normal     Neuro/Psych negative neurological ROS  negative psych ROS   GI/Hepatic negative GI ROS, Neg liver ROS,   Endo/Other  negative endocrine ROS  Renal/GU negative Renal ROS  negative genitourinary   Musculoskeletal negative musculoskeletal ROS (+)   Abdominal   Peds  Hematology negative hematology ROS (+)   Anesthesia Other Findings   Reproductive/Obstetrics (+) Pregnancy                             Anesthesia Physical Anesthesia Plan  ASA: II  Anesthesia Plan: Epidural   Post-op Pain Management:    Induction:   PONV Risk Score and Plan: Treatment may vary due to age or medical condition  Airway Management Planned: Natural Airway  Additional Equipment:   Intra-op Plan:   Post-operative Plan:   Informed Consent: I have reviewed the patients History and Physical, chart, labs and discussed the procedure including the risks, benefits and alternatives for the proposed anesthesia with the patient or authorized representative who has indicated his/her understanding and acceptance.       Plan Discussed with: Anesthesiologist  Anesthesia Plan Comments: (Patient identified. Risks, benefits, options discussed with patient including but not limited to bleeding, infection, nerve damage, paralysis, failed block, incomplete pain control, headache, blood pressure changes, nausea, vomiting, reactions to medication, itching, and post partum  back pain. Confirmed with bedside nurse the patient's most recent platelet count. Confirmed with the patient that they are not taking any anticoagulation, have any bleeding history or any family history of bleeding disorders. Patient expressed understanding and wishes to proceed. All questions were answered. )        Anesthesia Quick Evaluation  

## 2018-12-20 NOTE — Anesthesia Procedure Notes (Signed)
Epidural Patient location during procedure: OB Start time: 12/20/2018 3:25 PM End time: 12/20/2018 3:40 PM  Staffing Anesthesiologist: Freddrick March, MD Performed: anesthesiologist   Preanesthetic Checklist Completed: patient identified, pre-op evaluation, timeout performed, IV checked, risks and benefits discussed and monitors and equipment checked  Epidural Patient position: sitting Prep: site prepped and draped and DuraPrep Patient monitoring: continuous pulse ox, blood pressure, heart rate and cardiac monitor Approach: midline Location: L3-L4 Injection technique: LOR air  Needle:  Needle type: Tuohy  Needle gauge: 17 G Needle length: 9 cm Needle insertion depth: 5 cm Catheter type: closed end flexible Catheter size: 19 Gauge Catheter at skin depth: 11 cm Test dose: negative  Assessment Sensory level: T8 Events: blood not aspirated, injection not painful, no injection resistance, negative IV test and no paresthesia  Additional Notes Patient identified. Risks/Benefits/Options discussed with patient including but not limited to bleeding, infection, nerve damage, paralysis, failed block, incomplete pain control, headache, blood pressure changes, nausea, vomiting, reactions to medication both or allergic, itching and postpartum back pain. Confirmed with bedside nurse the patient's most recent platelet count. Confirmed with patient that they are not currently taking any anticoagulation, have any bleeding history or any family history of bleeding disorders. Patient expressed understanding and wished to proceed. All questions were answered. Sterile technique was used throughout the entire procedure. Please see nursing notes for vital signs. Test dose was given through epidural catheter and negative prior to continuing to dose epidural or start infusion. Warning signs of high block given to the patient including shortness of breath, tingling/numbness in hands, complete motor block,  or any concerning symptoms with instructions to call for help. Patient was given instructions on fall risk and not to get out of bed. All questions and concerns addressed with instructions to call with any issues or inadequate analgesia.  Reason for block:procedure for pain

## 2018-12-20 NOTE — H&P (Signed)
Morgan Beltran is a 23 y.o. female [phone] at [redacted]w[redacted]d presenting for labor evaluation and ROM with clear fluid at 1045.  Pregnancy is complicated by hx miscarriage x 3 and cholelithiasis diagnosed by RUQ [place] 11/05/18.     Nursing Staff Provider  Office Location  [facility] Dating  LMP  Language  English  Anatomy US  Normal  Flu Vaccine   Genetic Screen  NIPS: low risk   AFP:   First Screen:  Quad:    TDaP vaccine  10/15/18  Hgb A1C or  GTT Early  Third trimester: wnl   Rhogam   n/a   LAB RESULTS   Feeding Plan  Blood Type AB/Positive/-- (10/30 1130)   Contraception  Antibody Negative (10/30 1130)  Circumcision  Rubella 0.90 (10/30 1130)  Pediatrician   RPR Non Reactive (10/30 1130)   Support Person  HBsAg Negative (10/30 1130)   Prenatal Classes  HIV Non Reactive (10/30 1130)  BTL Consent  GBS  (negative  VBAC Consent  Pap 05/2017: negative    Hgb Electro      CF     SMA     Waterbirth  [ ]  Class [ ]  Consent [ ]  CNM visit    OB History    Gravida  5   Para  1   Term  1   Preterm  0   AB  3   Living  1     SAB  3   TAB  0   Ectopic  0   Multiple  0   Live Births  1          Past Medical History:  Diagnosis Date  . Asthma    childhood asthma, no inaher, no prob as adult  . Cervicitis 2014  . SVD (spontaneous vaginal delivery)    x 1   Past Surgical History:  Procedure Laterality Date  . DILATION AND EVACUATION N/A 06/27/2017   Procedure: DILATATION AND EVACUATION;  Surgeon: Osborne Oman, MD;  Location: [facility];  Service: Gynecology;  Laterality: N/A;  . WISDOM TOOTH EXTRACTION    . WISDOM TOOTH EXTRACTION  2012   Family History: family history includes Cerebral palsy in her brother; Diabetes in her maternal grandmother; Hypertension in her maternal grandmother; Lupus in her mother. Social History:  reports that she has never smoked. She has never used smokeless tobacco. She reports that she does not drink alcohol or use drugs.     Maternal Diabetes:  No Genetic Screening: Normal Maternal Ultrasounds/Referrals: Normal Fetal Ultrasounds or other Referrals:  None Maternal Substance Abuse:  No Significant Maternal Medications:  None Significant Maternal Lab Results:  Group B Strep negative Other Comments:  None  Review of Systems  Constitutional: Negative for chills and fever.  Respiratory: Negative for shortness of breath.   Cardiovascular: Negative for chest pain.  Gastrointestinal: Positive for abdominal pain. Negative for constipation, diarrhea and vomiting.  Neurological: Negative for dizziness and headaches.  All other systems reviewed and are negative.  Maternal Medical History:  Reason for admission: Rupture of membranes and contractions.   Contractions: Onset was 3-5 hours ago.   Perceived severity is moderate.    Fetal activity: Perceived fetal activity is normal.   Last perceived fetal movement was within the past hour.    Prenatal complications: no prenatal complications Prenatal Complications - Diabetes: none.    Dilation: 3.5 Effacement (%): 70, 80 Station: -3 Exam by:: n druebbisch rn Blood pressure 130/78, pulse 66, temperature  98.3 F (36.8 C), temperature source Oral, resp. rate 16, last menstrual period 03/23/2018, SpO2 100 %, unknown if currently breastfeeding. Maternal Exam:  Uterine Assessment: Contraction strength is mild.  Contraction frequency is regular.   Abdomen: Fetal presentation: vertex  Cervix: Cervix evaluated by digital exam.     Fetal Exam Fetal Monitor Review: Mode: ultrasound.   Baseline rate: 135.  Variability: moderate (6-25 bpm).   Pattern: accelerations present and no accelerations.    Fetal State Assessment: Category I - tracings are normal.     Physical Exam  Nursing note and vitals reviewed. Constitutional: She is oriented to person, place, and time. She appears well-developed and well-nourished.  Neck: Normal range of motion.  Cardiovascular: Normal rate, regular  rhythm and normal heart sounds.  Respiratory: Effort normal and breath sounds normal.  GI: Soft.  Musculoskeletal: Normal range of motion.  Neurological: She is alert and oriented to person, place, and time.  Skin: Skin is warm and dry.  Psychiatric: She has a normal mood and affect. Her behavior is normal. Judgment and thought content normal.    Prenatal labs: ABO, Rh: AB/Positive/-- (04/23 1041) Antibody: Negative (04/23 1041) Rubella: 0.94 (04/23 1041) RPR: Non Reactive (08/27 0901)  HBsAg: Negative (04/23 1041)  HIV: Non Reactive (08/27 0901)  GBS: Negative/-- (10/19 1615)   Assessment/Plan: 23 y.o. [id number] at [redacted]w[redacted]d admitted for PROM with onset of labor GBS negative  Admit to L&D Expectant management on admission with contractions becoming closer and more painful since ROM Anticipate NSVD   Sharen Counter 12/20/2018, 1:11 PM

## 2018-12-20 NOTE — MAU Note (Signed)
Morgan Beltran is a 23 y.o. at [redacted]w[redacted]d here in MAU reporting: a trickle starting around 1045, fluid is clear. Still feeling some leaking. Contractions started after LOF. They come about every 5 min. No bleeding. +FM  Onset of complaint: today  Pain score: 8/10   FHT: +FM  Lab orders placed from triage: none

## 2018-12-20 NOTE — Progress Notes (Signed)
Morgan Beltran is a 23 y.o. [phone] at [redacted]w[redacted]d admitted for active labor, rupture of membranes  Subjective: Pt comfortable with epidural, S/O at bedside for support.  Objective: BP (!) 127/103   Pulse 97   Temp 98.1 F (36.7 C) (Oral)   Resp 17   LMP 03/23/2018 (Exact Date)   SpO2 100%  No intake/output data recorded. No intake/output data recorded.  FHT:  FHR: 130 bpm, variability: moderate,  accelerations:  Present,  decelerations:  Absent UC:   regular, every 2-3 minutes SVE:   Dilation: 7.5 Effacement (%): 90 Station: 0 Exam by:: Wess Botts RN  Labs: Lab Results  Component Value Date   WBC 8.6 12/20/2018   HGB 10.4 (L) 12/20/2018   HCT 31.4 (L) 12/20/2018   MCV 87.7 12/20/2018   PLT 226 12/20/2018    Assessment / Plan: Spontaneous labor, progressing normally  Labor: Progressing normally Preeclampsia:  n/a Fetal Wellbeing:  Category I Pain Control:  Epidural I/D:  GBS neg Anticipated MOD:  NSVD  Fatima Blank 12/20/2018, 7:39 PM

## 2018-12-21 ENCOUNTER — Encounter (HOSPITAL_COMMUNITY): Payer: Self-pay

## 2018-12-21 DIAGNOSIS — Z3A39 39 weeks gestation of pregnancy: Secondary | ICD-10-CM

## 2018-12-21 LAB — CBC
HCT: 26 % — ABNORMAL LOW (ref 36.0–46.0)
Hemoglobin: 8.6 g/dL — ABNORMAL LOW (ref 12.0–15.0)
MCH: 29.1 pg (ref 26.0–34.0)
MCHC: 33.1 g/dL (ref 30.0–36.0)
MCV: 87.8 fL (ref 80.0–100.0)
Platelets: 222 10*3/uL (ref 150–400)
RBC: 2.96 MIL/uL — ABNORMAL LOW (ref 3.87–5.11)
RDW: 13 % (ref 11.5–15.5)
WBC: 18.1 10*3/uL — ABNORMAL HIGH (ref 4.0–10.5)
nRBC: 0 % (ref 0.0–0.2)

## 2018-12-21 LAB — RPR: RPR Ser Ql: NONREACTIVE

## 2018-12-21 LAB — ABO/RH: ABO/RH(D): AB POS

## 2018-12-21 MED ORDER — WITCH HAZEL-GLYCERIN EX PADS: 1.0000 "application " | MEDICATED_PAD | CUTANEOUS | Status: DC | PRN

## 2018-12-21 MED ORDER — INFLUENZA VAC SPLIT QUAD 0.5 ML IM SUSY: 0.5000 mL | PREFILLED_SYRINGE | INTRAMUSCULAR | Status: DC

## 2018-12-21 MED ORDER — BENZOCAINE-MENTHOL 20-0.5 % EX AERO
1.0000 "application " | INHALATION_SPRAY | CUTANEOUS | Status: DC | PRN
  Administered 2018-12-21: 1 via TOPICAL
  Filled 2018-12-21: qty 56

## 2018-12-21 MED ORDER — DIBUCAINE (PERIANAL) 1 % EX OINT: 1.0000 "application " | TOPICAL_OINTMENT | CUTANEOUS | Status: DC | PRN

## 2018-12-21 MED ORDER — MEASLES, MUMPS & RUBELLA VAC IJ SOLR
0.5000 mL | Freq: Once | INTRAMUSCULAR | Status: AC
  Administered 2018-12-22: 0.5 mL via SUBCUTANEOUS

## 2018-12-21 MED ORDER — DIPHENHYDRAMINE HCL 25 MG PO CAPS: 25.0000 mg | ORAL_CAPSULE | Freq: Four times a day (QID) | ORAL | Status: DC | PRN

## 2018-12-21 MED ORDER — TETANUS-DIPHTH-ACELL PERTUSSIS 5-2.5-18.5 LF-MCG/0.5 IM SUSP: 0.5000 mL | Freq: Once | INTRAMUSCULAR | Status: DC

## 2018-12-21 MED ORDER — ONDANSETRON HCL 4 MG PO TABS: 4.0000 mg | ORAL_TABLET | ORAL | Status: DC | PRN

## 2018-12-21 MED ORDER — ACETAMINOPHEN 325 MG PO TABS
650.0000 mg | ORAL_TABLET | ORAL | Status: DC | PRN
  Administered 2018-12-21 – 2018-12-22 (×5): 650 mg via ORAL
  Filled 2018-12-21 (×5): qty 2

## 2018-12-21 MED ORDER — PRENATAL MULTIVITAMIN CH
1.0000 | ORAL_TABLET | Freq: Every day | ORAL | Status: DC
  Administered 2018-12-21 – 2018-12-22 (×2): 1 via ORAL
  Filled 2018-12-21 (×2): qty 1

## 2018-12-21 MED ORDER — SIMETHICONE 80 MG PO CHEW
80.0000 mg | CHEWABLE_TABLET | ORAL | Status: DC | PRN
  Administered 2018-12-22: 80 mg via ORAL
  Filled 2018-12-21: qty 1

## 2018-12-21 MED ORDER — SENNOSIDES-DOCUSATE SODIUM 8.6-50 MG PO TABS
2.0000 | ORAL_TABLET | ORAL | Status: DC
  Administered 2018-12-21 (×2): 2 via ORAL
  Filled 2018-12-21 (×2): qty 2

## 2018-12-21 MED ORDER — IBUPROFEN 600 MG PO TABS
600.0000 mg | ORAL_TABLET | Freq: Four times a day (QID) | ORAL | Status: DC
  Administered 2018-12-21 – 2018-12-22 (×6): 600 mg via ORAL
  Filled 2018-12-21 (×6): qty 1

## 2018-12-21 MED ORDER — ONDANSETRON HCL 4 MG/2ML IJ SOLN
4.0000 mg | INTRAMUSCULAR | Status: DC | PRN
  Administered 2018-12-21: 4 mg via INTRAVENOUS
  Filled 2018-12-21: qty 2

## 2018-12-21 MED ORDER — ZOLPIDEM TARTRATE 5 MG PO TABS: 5.0000 mg | ORAL_TABLET | Freq: Every evening | ORAL | Status: DC | PRN

## 2018-12-21 MED ORDER — COCONUT OIL OIL: 1.0000 "application " | TOPICAL_OIL | Status: DC | PRN

## 2018-12-21 NOTE — Anesthesia Postprocedure Evaluation (Signed)
Anesthesia Post Note  Patient: Morgan Beltran  Procedure(s) Performed: AN AD [place]     Patient location during evaluation: Mother Baby Anesthesia Type: Epidural Level of consciousness: awake Pain management: satisfactory to patient Vital Signs Assessment: post-procedure vital signs reviewed and stable Respiratory status: spontaneous breathing Cardiovascular status: stable Anesthetic complications: no    Last Vitals:  Vitals:   12/21/18 0727 12/21/18 1056  BP: 114/67 110/76  Pulse: 65 64  Resp:  18  Temp:  36.4 C  SpO2:  99%    Last Pain:  Vitals:   12/21/18 1113  TempSrc:   PainSc: 2    Pain Goal: Patients Stated Pain Goal: 9 (12/20/18 1408)                 Casimer Lanius

## 2018-12-21 NOTE — Lactation Note (Signed)
This note was copied from a baby's chart. Lactation Consultation Note  Patient Name: Boy Iran Kievit [id number] Date: 12/21/2018  P2. 87 hour female infant. Infant had 6 wet voids and 5 stools since birth. Per mom, she feels breastfeeding is going well, infant has latched  7 to 10 times now since birth. Most feedings are for 20 minutes, LC did not observe a latch at this time, infant had finished breastfeeding prior to [facility] entering the room. Mom knows to Nurse or [facility] services if she has any questions, concerns or need assistance with latching infant to breast.     Maternal Data    Feeding    LATCH Score                   Interventions    Lactation Tools Discussed/Used     Consult Status      Vicente Serene 12/21/2018, 10:37 PM

## 2018-12-21 NOTE — Discharge Summary (Signed)
Postpartum Discharge Summary     Patient Name: Morgan Beltran DOB: 01/28/1996 MRN: [id number]  Date of admission: 12/20/2018 Delivering Provider: Derrel Nip   Date of discharge: 12/22/2018  Admitting diagnosis: watter broke  Intrauterine pregnancy: [redacted]w[redacted]d     Secondary diagnosis:  Active Problems:   Cholelithiasis affecting pregnancy in third trimester, antepartum   Normal labor   [redacted] weeks gestation of pregnancy  Additional problems: n/a     Discharge diagnosis: Term Pregnancy Delivered                                                                                                Post partum procedures:None  Augmentation: Pitocin  Complications: None  Hospital course:  Onset of Labor With Vaginal Delivery     23 y.o. yo [id number] at [redacted]w[redacted]d was admitted in Latent Labor on 12/20/2018. Patient had an uncomplicated labor course as follows: Augmented with pitocin and delivered with 10 minutes of pushing.  Membrane Rupture Time/Date: 10:45 AM ,12/20/2018   Intrapartum Procedures: Episiotomy: None [1]                                         Lacerations:  Labial [10]  Patient had a delivery of a Viable infant. 12/21/2018  Information for the patient's newborn:  Adalee, Abdo [[id number]]  Delivery Method: Vaginal, Spontaneous(Filed from Delivery Summary)     Pateint had an uncomplicated postpartum course. Cont ferrous sulfate on discharge. Declines birth control currently. She is ambulating, tolerating a regular diet, passing flatus, and urinating well. Patient is discharged home in stable condition on 12/22/18.  Delivery time: 3:55 AM    Magnesium Sulfate received: No BMZ received: No Rhophylac:No MMR:No Transfusion:No  Physical exam  Vitals:   12/21/18 1535 12/21/18 1945 12/21/18 2230 12/22/18 0500  BP: (!) 89/53 115/76 111/70 107/71  Pulse: 64 69 70 62  Resp: 18 18    Temp: 98.3 F (36.8 C) 97.7 F (36.5 C) 98.1 F (36.7 C) 98 F (36.7 C)  TempSrc: Oral  Oral Oral   SpO2: 99%  98% 99%   General: alert, cooperative and no distress Lochia: appropriate Uterine Fundus: firm DVT Evaluation: No evidence of DVT seen on physical exam. Labs: Lab Results  Component Value Date   WBC 18.1 (H) 12/21/2018   HGB 8.6 (L) 12/21/2018   HCT 26.0 (L) 12/21/2018   MCV 87.8 12/21/2018   PLT 222 12/21/2018   CMP Latest Ref Rng & Units 07/18/2018  Glucose 70 - 99 mg/dL 89  BUN 6 - 20 mg/dL 5(L)  Creatinine 3.24 - 1.00 mg/dL 4.01  Sodium 027 - 253 mmol/L 136  Potassium 3.5 - 5.1 mmol/L 3.8  Chloride 98 - 111 mmol/L 105  CO2 22 - 32 mmol/L 22  Calcium 8.9 - 10.3 mg/dL 6.6(Y)  Total Protein 6.5 - 8.1 g/dL 6.1(L)  Total Bilirubin 0.3 - 1.2 mg/dL 0.4  Alkaline Phos 38 - 126 U/L 47  AST 15 - 41 U/L 21  ALT 0 -  44 U/L 18    Discharge instruction: per After Visit Summary and "Baby and Me Booklet".  After visit meds:  Allergies as of 12/22/2018      Reactions   Penicillins Hives   Has patient had a PCN reaction causing immediate rash, facial/tongue/throat swelling, SOB or lightheadedness with hypotension: yes Has patient had a PCN reaction causing severe rash involving mucus membranes or skin necrosis: no Has patient had a PCN reaction that required hospitalization: no Has patient had a PCN reaction occurring within the last 10 years: no If all of the above answers are "NO", then may proceed with Cephalosporin use.   Amoxicillin Rash      Medication List    STOP taking these medications   diphenhydramine-acetaminophen 25-500 MG Tabs tablet Commonly known as: TYLENOL PM   doxylamine (Sleep) 25 MG tablet Commonly known as: UNISOM   folic acid 1 MG tablet Commonly known as: FOLVITE   ondansetron 8 MG disintegrating tablet Commonly known as: Zofran ODT   progesterone 200 MG capsule Commonly known as: PROMETRIUM     TAKE these medications   acetaminophen 500 MG tablet Commonly known as: TYLENOL Take 500 mg by mouth every 8 (eight) hours  as needed.   calcium carbonate 500 MG chewable tablet Commonly known as: TUMS - dosed in mg elemental calcium Chew 1 tablet by mouth as needed for indigestion or heartburn.   docusate sodium 100 MG capsule Commonly known as: COLACE Take 1 capsule (100 mg total) by mouth 2 (two) times daily as needed for mild constipation or moderate constipation.   ferrous sulfate 325 (65 FE) MG tablet Take 325 mg by mouth daily with breakfast.   ibuprofen 600 MG tablet Commonly known as: ADVIL Take 1 tablet (600 mg total) by mouth every 6 (six) hours as needed for headache, mild pain, moderate pain or cramping.   PNV Prenatal Plus Multivitamin 27-1 MG Tabs Take 1 tablet by mouth daily.       Diet: routine diet  Activity: Advance as tolerated. Pelvic rest for 6 weeks.   Outpatient follow up:4 weeks Follow up Appt: Future Appointments  Date Time Provider Department Center  01/19/2019  2:30 PM Anyanwu, Jethro Bastos, MD [facility]   Follow up Visit:  Please schedule this patient for PP visit in: 4 weeks Low risk pregnancy complicated by: n/a Delivery mode:  SVD Anticipated Birth Control:  other/unsure PP Procedures needed: n/a  Schedule Integrated BH visit: no Provider: Any provider  Newborn Data: Live born female  Birth Weight: 3779g  APGAR: 8, 9  Newborn Delivery   Birth date/time: 12/21/2018 03:55:00 Delivery type: Vaginal, Spontaneous      Baby Feeding: Breast Disposition:home with mother

## 2018-12-21 NOTE — Lactation Note (Signed)
This note was copied from a baby's chart. Lactation Consultation Note  Patient Name: Morgan Beltran [id number] Date: 12/21/2018 Reason for consult: Initial assessment;Term  LC in to visit with P2 Mom of term baby at 8 hrs old.  Mom has a 24 yr old that she breastfed without problems for 7 months.   Baby has had 2 short feedings at the breast, but has been sleeping now.  Mom holding baby swaddled on her chest.  Encouraged Mom to unwrap baby and keep him STS to encouraged baby to awaken and latch to breast.  Reviewed normal newborn sleepiness in first day of life.  Mom instructed on breast massage and hand expression, and knows about spoon feeding baby.  Mom is very tired right now, and baby isn't showing any cues when unwrapped and placed tummy to tummy with Mom.    Mom aware of IP lactation support available to her and encouraged her to ask for help.    Consult Status Consult Status: Follow-up Date: 12/22/18 Follow-up type: In-patient    Broadus John 12/21/2018, 11:34 AM

## 2018-12-21 NOTE — Progress Notes (Signed)
LABOR PROGRESS NOTE  [name] is a 23 y.o. [phone] at [redacted]w[redacted]d  admitted for active labor with SROM.  Subjective: Patient is feeling contractions but they are manageable due to epidural.  Objective: BP 120/79   Pulse 88   Temp 98.5 F (36.9 C) (Oral)   Resp 17   LMP 03/23/2018 (Exact Date)   SpO2 100%  or  Vitals:   12/21/18 0001 12/21/18 0016 12/21/18 0031 12/21/18 0101  BP: 112/69 104/73 110/66 120/79  Pulse: 85 95 75 88  Resp: 17 17 17 17   Temp:      TempSrc:      SpO2:         Dilation: Lip/rim Effacement (%): 100 Cervical Position: Middle Station: Plus 1 Presentation: Vertex Exam by:: Gifford Shave, MD FHT: baseline rate 130, moderate varibility, + acel, occasional variable decel Toco: q2-4 min   Labs: Lab Results  Component Value Date   WBC 8.6 12/20/2018   HGB 10.4 (L) 12/20/2018   HCT 31.4 (L) 12/20/2018   MCV 87.7 12/20/2018   PLT 226 12/20/2018    Patient Active Problem List   Diagnosis Date Noted  . Cholelithiasis affecting pregnancy in third trimester, antepartum 12/20/2018  . Normal labor 12/20/2018  . UTI (urinary tract infection) during pregnancy, first trimester 06/15/2018  . Supervision of other normal pregnancy, antepartum 06/10/2018  . Threatened abortion affecting intrauterine pregnancy 05/06/2018  . Pregnancy complicated by previous recurrent miscarriages x 3 01/14/2018  . Rubella non-immune status, antepartum 12/31/2017  . Asthma 04/28/2012    Assessment / Plan: 23 y.o. [id number] at [redacted]w[redacted]d here for SOL  Labor: Progressing well.  Continue Pitocin and continue to monitor Fetal Wellbeing: Category 2 but reassuring given variability and accelerations Pain Control: Epidural in place Anticipated MOD: Vaginal  Gifford Shave, MD  PGY-1, [facility]  12/21/2018, 1:43 AM

## 2018-12-22 DIAGNOSIS — Z3A39 39 weeks gestation of pregnancy: Secondary | ICD-10-CM

## 2018-12-22 MED ORDER — FERROUS SULFATE 325 (65 FE) MG PO TABS: 325.0000 mg | ORAL_TABLET | Freq: Every day | ORAL | Status: DC

## 2018-12-22 NOTE — Lactation Note (Signed)
This note was copied from a baby's chart. Lactation Consultation Note  Patient Name: Boy Telena Peyser [id number] Date: 12/22/2018 Reason for consult: Follow-up assessment;Term;Infant weight loss  32 hours old FT female who is being exclusively BF by his mother, she's a P2. Mom and baby are going home today, baby is at 5% weight loss. Per mom feedings at the breast are comfortable and she can feel baby is transferring, mom said she can hear "gulping" when baby is at the breast.  Reviewed discharge instructions, engorgement prevention/treatment, treatment/prevention of sore nipples and red flags on when to call baby's pediatrician. Mom reported all questions and concerns were answered, she's aware of [facility] OP services and will contact PRN.  Maternal Data    Feeding Feeding Type: Breast Fed  LATCH Score                   Interventions Interventions: Breast feeding basics reviewed  Lactation Tools Discussed/Used     Consult Status Consult Status: Complete Date: 12/22/18 Follow-up type: Call as needed    Danetta Prom Francene Boyers 12/22/2018, 6:22 PM

## 2018-12-23 ENCOUNTER — Encounter: Payer: Medicaid Other | Admitting: Family Medicine

## 2019-01-19 ENCOUNTER — Other Ambulatory Visit: Payer: Self-pay

## 2019-01-19 ENCOUNTER — Ambulatory Visit (INDEPENDENT_AMBULATORY_CARE_PROVIDER_SITE_OTHER): Payer: Medicaid Other | Admitting: Obstetrics & Gynecology

## 2019-01-19 ENCOUNTER — Encounter: Payer: Self-pay | Admitting: Obstetrics & Gynecology

## 2019-01-19 DIAGNOSIS — Z1389 Encounter for screening for other disorder: Secondary | ICD-10-CM | POA: Diagnosis not present

## 2019-01-19 NOTE — Progress Notes (Signed)
    Post Partum Exam  Morgan Beltran is a 23 y.o. [phone] female who presents for a postpartum visit. She is 4 weeks postpartum following a spontaneous vaginal delivery. I have fully reviewed the prenatal and intrapartum course. The delivery was at 39.0 gestational weeks.  Anesthesia: epidural. Postpartum course has been uncomplicated. Baby's course has been uncomplicated. Baby is feeding by breast. Bleeding no bleeding. Bowel function is normal. Bladder function is normal. Patient is sexually active. Contraception method is none. Postpartum depression screening:neg  The following portions of the patient's history were reviewed and updated as appropriate: allergies, current medications, past family history, past medical history, past social history, past surgical history and problem list. Last pap smear done 06/02/2017 and was Normal  Review of Systems Pertinent items noted in HPI and remainder of comprehensive ROS otherwise negative.    Objective:  Last menstrual period 03/23/2018, unknown if currently breastfeeding.  General:  alert and no distress   Breasts:  inspection negative, no nipple discharge or bleeding, no masses or nodularity palpable  Lungs: clear to auscultation bilaterally  Heart:  regular rate and rhythm, S1, S2 normal, no murmur, click, rub or gallop  Abdomen: soft, non-tender; bowel sounds normal; no masses,  no organomegaly  Pelvic:  not evaluated   Assessment:  Normal postpartum exam. Pap smear not done at today's visit.   Plan:   1. Contraception: declined 2. No other concerns 3. Follow up as needed.    Verita Schneiders, MD, [place] [facility] [employer], [place]

## 2020-03-02 ENCOUNTER — Other Ambulatory Visit: Payer: Self-pay | Admitting: Obstetrics and Gynecology

## 2020-03-02 ENCOUNTER — Telehealth: Payer: Self-pay | Admitting: *Deleted

## 2020-03-02 MED ORDER — PROGESTERONE 200 MG PO CAPS: 200.0000 mg | ORAL_CAPSULE | Freq: Every day | ORAL | 1 refills | Status: DC
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
## 2020-03-02 NOTE — Telephone Encounter (Signed)
Spoke with Dr Vergie Living regarding her message, then called patient back. Will restart pt back on progesterone and get her in for ultrasound in about 3 weeks to confirm viable pregnancy. Pt verbalizes and understands.

## 2020-12-09 ENCOUNTER — Inpatient Hospital Stay (HOSPITAL_COMMUNITY)
Admission: EM | Admit: 2020-12-09 | Discharge: 2020-12-15 | DRG: 082 | Disposition: A | Discharge status: Home / Self Care | Payer: Medicaid Other | Attending: Surgery | Admitting: Surgery

## 2020-12-09 ENCOUNTER — Encounter (HOSPITAL_COMMUNITY): Payer: Self-pay

## 2020-12-09 ENCOUNTER — Inpatient Hospital Stay (HOSPITAL_COMMUNITY): Payer: Medicaid Other

## 2020-12-09 ENCOUNTER — Emergency Department (HOSPITAL_COMMUNITY): Payer: Medicaid Other

## 2020-12-09 ENCOUNTER — Other Ambulatory Visit: Payer: Self-pay

## 2020-12-09 DIAGNOSIS — R402112 Coma scale, eyes open, never, at arrival to emergency department: Secondary | ICD-10-CM | POA: Diagnosis present

## 2020-12-09 DIAGNOSIS — R402212 Coma scale, best verbal response, none, at arrival to emergency department: Secondary | ICD-10-CM | POA: Diagnosis present

## 2020-12-09 DIAGNOSIS — G936 Cerebral edema: Secondary | ICD-10-CM | POA: Diagnosis present

## 2020-12-09 DIAGNOSIS — S022XXA Fracture of nasal bones, initial encounter for closed fracture: Secondary | ICD-10-CM | POA: Diagnosis present

## 2020-12-09 DIAGNOSIS — R1310 Dysphagia, unspecified: Secondary | ICD-10-CM | POA: Diagnosis present

## 2020-12-09 DIAGNOSIS — Z781 Physical restraint status: Secondary | ICD-10-CM | POA: Diagnosis not present

## 2020-12-09 DIAGNOSIS — S060XAA Concussion with loss of consciousness status unknown, initial encounter: Secondary | ICD-10-CM | POA: Diagnosis present

## 2020-12-09 DIAGNOSIS — Z4659 Encounter for fitting and adjustment of other gastrointestinal appliance and device: Secondary | ICD-10-CM

## 2020-12-09 DIAGNOSIS — R4182 Altered mental status, unspecified: Secondary | ICD-10-CM | POA: Diagnosis not present

## 2020-12-09 DIAGNOSIS — Z880 Allergy status to penicillin: Secondary | ICD-10-CM | POA: Diagnosis not present

## 2020-12-09 DIAGNOSIS — S43109A Unspecified dislocation of unspecified acromioclavicular joint, initial encounter: Secondary | ICD-10-CM

## 2020-12-09 DIAGNOSIS — R402362 Coma scale, best motor response, obeys commands, at arrival to emergency department: Secondary | ICD-10-CM | POA: Diagnosis present

## 2020-12-09 DIAGNOSIS — T1490XA Injury, unspecified, initial encounter: Secondary | ICD-10-CM | POA: Diagnosis present

## 2020-12-09 DIAGNOSIS — S43101A Unspecified dislocation of right acromioclavicular joint, initial encounter: Secondary | ICD-10-CM | POA: Diagnosis present

## 2020-12-09 DIAGNOSIS — I6330 Cerebral infarction due to thrombosis of unspecified cerebral artery: Secondary | ICD-10-CM | POA: Diagnosis not present

## 2020-12-09 DIAGNOSIS — Z20822 Contact with and (suspected) exposure to covid-19: Secondary | ICD-10-CM | POA: Diagnosis present

## 2020-12-09 DIAGNOSIS — E876 Hypokalemia: Secondary | ICD-10-CM | POA: Diagnosis not present

## 2020-12-09 DIAGNOSIS — Z23 Encounter for immunization: Secondary | ICD-10-CM | POA: Diagnosis not present

## 2020-12-09 DIAGNOSIS — S060X9A Concussion with loss of consciousness of unspecified duration, initial encounter: Secondary | ICD-10-CM | POA: Diagnosis not present

## 2020-12-09 DIAGNOSIS — Y92410 Unspecified street and highway as the place of occurrence of the external cause: Secondary | ICD-10-CM | POA: Diagnosis not present

## 2020-12-09 DIAGNOSIS — I9589 Other hypotension: Secondary | ICD-10-CM | POA: Diagnosis not present

## 2020-12-09 DIAGNOSIS — S0990XA Unspecified injury of head, initial encounter: Secondary | ICD-10-CM

## 2020-12-09 DIAGNOSIS — J9690 Respiratory failure, unspecified, unspecified whether with hypoxia or hypercapnia: Secondary | ICD-10-CM | POA: Diagnosis present

## 2020-12-09 DIAGNOSIS — I63332 Cerebral infarction due to thrombosis of left posterior cerebral artery: Secondary | ICD-10-CM | POA: Diagnosis not present

## 2020-12-09 DIAGNOSIS — R569 Unspecified convulsions: Secondary | ICD-10-CM | POA: Diagnosis not present

## 2020-12-09 DIAGNOSIS — I639 Cerebral infarction, unspecified: Secondary | ICD-10-CM | POA: Diagnosis not present

## 2020-12-09 DIAGNOSIS — D62 Acute posthemorrhagic anemia: Secondary | ICD-10-CM | POA: Diagnosis present

## 2020-12-09 DIAGNOSIS — I6389 Other cerebral infarction: Secondary | ICD-10-CM | POA: Diagnosis not present

## 2020-12-09 LAB — URINALYSIS, ROUTINE W REFLEX MICROSCOPIC
Bacteria, UA: NONE SEEN
Bilirubin Urine: NEGATIVE
Glucose, UA: NEGATIVE mg/dL
Ketones, ur: NEGATIVE mg/dL
Nitrite: NEGATIVE
Protein, ur: 30 mg/dL — AB
Specific Gravity, Urine: >1.046 — ABNORMAL HIGH (ref 1.005–1.030)
WBC, UA: >50 WBC/hpf — ABNORMAL HIGH (ref 0–5)
pH: 6 (ref 5.0–8.0)

## 2020-12-09 LAB — COMPREHENSIVE METABOLIC PANEL
ALT: 59 U/L — ABNORMAL HIGH (ref 0–44)
AST: 114 U/L — ABNORMAL HIGH (ref 15–41)
Albumin: 3.7 g/dL (ref 3.5–5.0)
Alkaline Phosphatase: 46 U/L (ref 38–126)
Anion gap: 11 (ref 5–15)
BUN: 11 mg/dL (ref 6–20)
CO2: 19 mmol/L — ABNORMAL LOW (ref 22–32)
Calcium: 8.3 mg/dL — ABNORMAL LOW (ref 8.9–10.3)
Chloride: 110 mmol/L (ref 98–111)
Creatinine, Ser: 0.86 mg/dL (ref 0.44–1.00)
GFR, Estimated: >60 mL/min (ref >60)
Glucose, Bld: 124 mg/dL — ABNORMAL HIGH (ref 70–99)
Potassium: 3.7 mmol/L (ref 3.5–5.1)
Sodium: 140 mmol/L (ref 135–145)
Total Bilirubin: 0.6 mg/dL (ref 0.3–1.2)
Total Protein: 6.7 g/dL (ref 6.5–8.1)

## 2020-12-09 LAB — RAPID URINE DRUG SCREEN, HOSP PERFORMED
Amphetamines: NOT DETECTED
Barbiturates: NOT DETECTED
Benzodiazepines: NOT DETECTED
Cocaine: NOT DETECTED
Opiates: NOT DETECTED
Tetrahydrocannabinol: NOT DETECTED

## 2020-12-09 LAB — HIV ANTIBODY (ROUTINE TESTING W REFLEX): HIV Screen 4th Generation wRfx: NONREACTIVE

## 2020-12-09 LAB — LACTIC ACID, PLASMA: Lactic Acid, Venous: 2.9 mmol/L (ref 0.5–1.9)

## 2020-12-09 LAB — CBC
HCT: 37.5 % (ref 36.0–46.0)
Hemoglobin: 12.5 g/dL (ref 12.0–15.0)
MCH: 31.3 pg (ref 26.0–34.0)
MCHC: 33.3 g/dL (ref 30.0–36.0)
MCV: 94 fL (ref 80.0–100.0)
Platelets: 312 10*3/uL (ref 150–400)
RBC: 3.99 MIL/uL (ref 3.87–5.11)
RDW: 11.9 % (ref 11.5–15.5)
WBC: 15.5 10*3/uL — ABNORMAL HIGH (ref 4.0–10.5)
nRBC: 0 % (ref 0.0–0.2)

## 2020-12-09 LAB — SAMPLE TO BLOOD BANK

## 2020-12-09 LAB — PROTIME-INR
INR: 1.1 (ref 0.8–1.2)
Prothrombin Time: 14.4 seconds (ref 11.4–15.2)

## 2020-12-09 LAB — RESP PANEL BY RT-PCR (FLU A&B, COVID) ARPGX2
Influenza A by PCR: NEGATIVE
Influenza B by PCR: NEGATIVE
SARS Coronavirus 2 by RT PCR: NEGATIVE

## 2020-12-09 LAB — ETHANOL: Alcohol, Ethyl (B): <10 mg/dL (ref <10)
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
## 2020-12-09 MED ORDER — FENTANYL CITRATE PF 50 MCG/ML IJ SOSY
50.0000 ug | PREFILLED_SYRINGE | Freq: Once | INTRAMUSCULAR | Status: AC
  Administered 2020-12-09: 50 ug via INTRAVENOUS

## 2020-12-09 MED ORDER — FENTANYL CITRATE PF 50 MCG/ML IJ SOSY
PREFILLED_SYRINGE | INTRAMUSCULAR | Status: AC
  Administered 2020-12-09: 50 ug via INTRAVENOUS
  Filled 2020-12-09: qty 1

## 2020-12-09 MED ORDER — OXYCODONE HCL 5 MG PO TABS
5.0000 mg | ORAL_TABLET | ORAL | Status: DC | PRN
  Administered 2020-12-09 – 2020-12-12 (×11): 10 mg
  Administered 2020-12-14 (×2): 5 mg
  Administered 2020-12-15: 10 mg
  Administered 2020-12-15: 5 mg
  Filled 2020-12-09: qty 2
  Filled 2020-12-09: qty 1
  Filled 2020-12-09 (×4): qty 2
  Filled 2020-12-09: qty 1
  Filled 2020-12-09 (×2): qty 2
  Filled 2020-12-09: qty 1
  Filled 2020-12-09 (×4): qty 2

## 2020-12-09 MED ORDER — PROPOFOL 1000 MG/100ML IV EMUL
INTRAVENOUS | Status: AC
  Administered 2020-12-09: 10 ug/kg/min via INTRAVENOUS
  Filled 2020-12-09: qty 100

## 2020-12-09 MED ORDER — FENTANYL BOLUS VIA INFUSION
50.0000 ug | INTRAVENOUS | Status: DC | PRN
Start: 2020-12-09 — End: 2020-12-09
  Filled 2020-12-09: qty 50

## 2020-12-09 MED ORDER — FENTANYL CITRATE PF 50 MCG/ML IJ SOSY: 50.0000 ug | PREFILLED_SYRINGE | Freq: Once | INTRAMUSCULAR | Status: AC

## 2020-12-09 MED ORDER — FENTANYL 2500MCG IN NS 250ML (10MCG/ML) PREMIX INFUSION
50.0000 ug/h | INTRAVENOUS | Status: DC
  Administered 2020-12-09: 200 ug/h via INTRAVENOUS
  Administered 2020-12-09 – 2020-12-10 (×2): 150 ug/h via INTRAVENOUS
  Administered 2020-12-11: 25 ug/h via INTRAVENOUS
  Filled 2020-12-09 (×3): qty 250

## 2020-12-09 MED ORDER — ACETAMINOPHEN 500 MG PO TABS
1000.0000 mg | ORAL_TABLET | Freq: Four times a day (QID) | ORAL | Status: DC
  Administered 2020-12-09 – 2020-12-10 (×3): 1000 mg via ORAL
  Filled 2020-12-09 (×3): qty 2

## 2020-12-09 MED ORDER — METHOCARBAMOL 500 MG PO TABS
1000.0000 mg | ORAL_TABLET | Freq: Three times a day (TID) | ORAL | Status: DC
  Administered 2020-12-09 (×2): 1000 mg via ORAL
  Filled 2020-12-09 (×2): qty 2

## 2020-12-09 MED ORDER — ETOMIDATE 2 MG/ML IV SOLN
INTRAVENOUS | Status: AC | PRN
  Administered 2020-12-09: 20 mg via INTRAVENOUS

## 2020-12-09 MED ORDER — FENTANYL BOLUS VIA INFUSION
50.0000 ug | INTRAVENOUS | Status: DC | PRN
  Administered 2020-12-09 – 2020-12-10 (×3): 50 ug via INTRAVENOUS
  Filled 2020-12-09: qty 100

## 2020-12-09 MED ORDER — OXYCODONE HCL 5 MG PO TABS
5.0000 mg | ORAL_TABLET | ORAL | Status: DC | PRN
  Administered 2020-12-09: 10 mg via ORAL
  Filled 2020-12-09 (×2): qty 2

## 2020-12-09 MED ORDER — ENOXAPARIN SODIUM 30 MG/0.3ML IJ SOSY
30.0000 mg | PREFILLED_SYRINGE | Freq: Two times a day (BID) | INTRAMUSCULAR | Status: DC
  Administered 2020-12-10 – 2020-12-15 (×11): 30 mg via SUBCUTANEOUS
  Filled 2020-12-09 (×11): qty 0.3

## 2020-12-09 MED ORDER — FENTANYL 2500MCG IN NS 250ML (10MCG/ML) PREMIX INFUSION
25.0000 ug/h | INTRAVENOUS | Status: DC
  Administered 2020-12-09: 50 ug/h via INTRAVENOUS
  Filled 2020-12-09: qty 250

## 2020-12-09 MED ORDER — CEFAZOLIN SODIUM-DEXTROSE 2-4 GM/100ML-% IV SOLN
2.0000 g | Freq: Three times a day (TID) | INTRAVENOUS | Status: DC
  Administered 2020-12-09: 2 g via INTRAVENOUS

## 2020-12-09 MED ORDER — IOHEXOL 350 MG/ML SOLN
80.0000 mL | Freq: Once | INTRAVENOUS | Status: AC | PRN
  Administered 2020-12-09: 80 mL via INTRAVENOUS

## 2020-12-09 MED ORDER — LACTATED RINGERS IV SOLN: INTRAVENOUS | Status: AC

## 2020-12-09 MED ORDER — DOCUSATE SODIUM 100 MG PO CAPS
100.0000 mg | ORAL_CAPSULE | Freq: Two times a day (BID) | ORAL | Status: DC
  Administered 2020-12-09: 100 mg via ORAL
  Filled 2020-12-09: qty 1

## 2020-12-09 MED ORDER — POLYETHYLENE GLYCOL 3350 17 G PO PACK
17.0000 g | PACK | Freq: Every day | ORAL | Status: DC
  Administered 2020-12-10 – 2020-12-13 (×4): 17 g
  Filled 2020-12-09 (×4): qty 1

## 2020-12-09 MED ORDER — ONDANSETRON 4 MG PO TBDP
4.0000 mg | ORAL_TABLET | Freq: Four times a day (QID) | ORAL | Status: DC | PRN
  Administered 2020-12-14: 4 mg via ORAL
  Filled 2020-12-09 (×2): qty 1

## 2020-12-09 MED ORDER — PROPOFOL 1000 MG/100ML IV EMUL: 0.0000 ug/kg/min | INTRAVENOUS | Status: DC

## 2020-12-09 MED ORDER — CEFAZOLIN SODIUM-DEXTROSE 2-4 GM/100ML-% IV SOLN: 2.0000 g | Freq: Once | INTRAVENOUS | Status: DC

## 2020-12-09 MED ORDER — PROPOFOL 1000 MG/100ML IV EMUL
0.0000 ug/kg/min | INTRAVENOUS | Status: DC
  Administered 2020-12-09: 30 ug/kg/min via INTRAVENOUS
  Administered 2020-12-09: 50 ug/kg/min via INTRAVENOUS
  Administered 2020-12-10 (×3): 40 ug/kg/min via INTRAVENOUS
  Administered 2020-12-11: 50 ug/kg/min via INTRAVENOUS
  Administered 2020-12-11: 30 ug/kg/min via INTRAVENOUS
  Administered 2020-12-11: 40 ug/kg/min via INTRAVENOUS
  Administered 2020-12-12: 50 ug/kg/min via INTRAVENOUS
  Administered 2020-12-12: 45 ug/kg/min via INTRAVENOUS
  Filled 2020-12-09 (×12): qty 100

## 2020-12-09 MED ORDER — TETANUS-DIPHTH-ACELL PERTUSSIS 5-2.5-18.5 LF-MCG/0.5 IM SUSY
0.5000 mL | PREFILLED_SYRINGE | Freq: Once | INTRAMUSCULAR | Status: AC
  Administered 2020-12-09: 0.5 mL via INTRAMUSCULAR

## 2020-12-09 MED ORDER — CHLORHEXIDINE GLUCONATE CLOTH 2 % EX PADS
6.0000 | MEDICATED_PAD | Freq: Every day | CUTANEOUS | Status: DC
  Administered 2020-12-10 – 2020-12-15 (×6): 6 via TOPICAL

## 2020-12-09 MED ORDER — PROPOFOL 1000 MG/100ML IV EMUL
0.0000 ug/kg/min | INTRAVENOUS | Status: DC
  Administered 2020-12-09: 20 ug/kg/min via INTRAVENOUS

## 2020-12-09 MED ORDER — MIDAZOLAM HCL 2 MG/2ML IJ SOLN
2.0000 mg | INTRAMUSCULAR | Status: AC | PRN
Start: 2020-12-09 — End: 2020-12-11
  Administered 2020-12-10 – 2020-12-11 (×3): 2 mg via INTRAVENOUS
  Filled 2020-12-09 (×5): qty 2

## 2020-12-09 MED ORDER — ONDANSETRON HCL 4 MG/2ML IJ SOLN
4.0000 mg | Freq: Four times a day (QID) | INTRAMUSCULAR | Status: DC | PRN
  Administered 2020-12-12 – 2020-12-13 (×3): 4 mg via INTRAVENOUS
  Filled 2020-12-09 (×3): qty 2

## 2020-12-09 MED ORDER — MIDAZOLAM HCL 2 MG/2ML IJ SOLN
2.0000 mg | INTRAMUSCULAR | Status: DC | PRN
  Administered 2020-12-10 – 2020-12-11 (×3): 2 mg via INTRAVENOUS
  Filled 2020-12-09 (×5): qty 2

## 2020-12-09 MED ORDER — SUCCINYLCHOLINE CHLORIDE 20 MG/ML IJ SOLN
INTRAMUSCULAR | Status: AC | PRN
  Administered 2020-12-09: 100 mg via INTRAVENOUS
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
## 2020-12-09 NOTE — Progress Notes (Signed)
ABG results at 10:11 AM, 7.473, 27.0, 183, -3, HCO3 19.8 mmol/L, sO2 100%, ISTAT not crossing over to Epic.
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
## 2020-12-09 NOTE — ED Notes (Addendum)
Patient transported to CT, scanner 1.

## 2020-12-09 NOTE — ED Notes (Addendum)
Dr, Rosalia Hammers updated family (in consultation room) while pt is CT. Trauma RN, Dr. Bedelia Person, Ryan RN & RT is with pt in CT.
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
## 2020-12-09 NOTE — Progress Notes (Signed)
Chaplain responded to Level 1.  Parents arrived and chaplain took them to Consultation Room A.  Chaplain asked the patient's co-workers-EMTs--who worked with patient and cared for patient as she was brought to hospital to talk to the parents waiting for news or answers.  Chaplain referred the patient to incoming Mallory Shirk. And informed PA that parents were hoping to hear from her soon. Rev. Lynnell Chad Pager [phone]
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
## 2020-12-10 ENCOUNTER — Encounter (HOSPITAL_COMMUNITY): Payer: Self-pay

## 2020-12-10 LAB — POCT I-STAT 7, (LYTES, BLD GAS, ICA,H+H)
Acid-base deficit: 3 mmol/L — ABNORMAL HIGH (ref 0.0–2.0)
Bicarbonate: 19.8 mmol/L — ABNORMAL LOW (ref 20.0–28.0)
Calcium, Ion: 1.1 mmol/L — ABNORMAL LOW (ref 1.15–1.40)
HCT: 32 % — ABNORMAL LOW (ref 36.0–46.0)
Hemoglobin: 10.9 g/dL — ABNORMAL LOW (ref 12.0–15.0)
O2 Saturation: 100 %
Patient temperature: 98.6
Potassium: 3.3 mmol/L — ABNORMAL LOW (ref 3.5–5.1)
Sodium: 139 mmol/L (ref 135–145)
TCO2: 21 mmol/L — ABNORMAL LOW (ref 22–32)
pCO2 arterial: 27 mmHg — ABNORMAL LOW (ref 32.0–48.0)
pH, Arterial: 7.473 — ABNORMAL HIGH (ref 7.350–7.450)
pO2, Arterial: 183 mmHg — ABNORMAL HIGH (ref 83.0–108.0)

## 2020-12-10 LAB — POCT I-STAT, CHEM 8
BUN: 11 mg/dL (ref 6–20)
Calcium, Ion: 1.03 mmol/L — ABNORMAL LOW (ref 1.15–1.40)
Chloride: 109 mmol/L (ref 98–111)
Creatinine, Ser: 0.7 mg/dL (ref 0.44–1.00)
Glucose, Bld: 127 mg/dL — ABNORMAL HIGH (ref 70–99)
HCT: 36 % (ref 36.0–46.0)
Hemoglobin: 12.2 g/dL (ref 12.0–15.0)
Potassium: 3.6 mmol/L (ref 3.5–5.1)
Sodium: 142 mmol/L (ref 135–145)
TCO2: 20 mmol/L — ABNORMAL LOW (ref 22–32)

## 2020-12-10 LAB — CBC
HCT: 31.5 % — ABNORMAL LOW (ref 36.0–46.0)
Hemoglobin: 10.6 g/dL — ABNORMAL LOW (ref 12.0–15.0)
MCH: 31.5 pg (ref 26.0–34.0)
MCHC: 33.7 g/dL (ref 30.0–36.0)
MCV: 93.8 fL (ref 80.0–100.0)
Platelets: 196 10*3/uL (ref 150–400)
RBC: 3.36 MIL/uL — ABNORMAL LOW (ref 3.87–5.11)
RDW: 11.9 % (ref 11.5–15.5)
WBC: 8.3 10*3/uL (ref 4.0–10.5)
nRBC: 0 % (ref 0.0–0.2)

## 2020-12-10 LAB — BASIC METABOLIC PANEL
Anion gap: 8 (ref 5–15)
BUN: 7 mg/dL (ref 6–20)
CO2: 18 mmol/L — ABNORMAL LOW (ref 22–32)
Calcium: 7.9 mg/dL — ABNORMAL LOW (ref 8.9–10.3)
Chloride: 113 mmol/L — ABNORMAL HIGH (ref 98–111)
Creatinine, Ser: 0.88 mg/dL (ref 0.44–1.00)
GFR, Estimated: >60 mL/min (ref >60)
Glucose, Bld: 83 mg/dL (ref 70–99)
Potassium: 2.9 mmol/L — ABNORMAL LOW (ref 3.5–5.1)
Sodium: 139 mmol/L (ref 135–145)

## 2020-12-10 LAB — MRSA NEXT GEN BY PCR, NASAL: MRSA by PCR Next Gen: NOT DETECTED

## 2020-12-10 LAB — TRIGLYCERIDES: Triglycerides: 98 mg/dL (ref <150)

## 2020-12-10 MED ORDER — ACETAMINOPHEN 500 MG PO TABS
1000.0000 mg | ORAL_TABLET | Freq: Four times a day (QID) | ORAL | Status: DC
  Administered 2020-12-10 – 2020-12-13 (×13): 1000 mg
  Filled 2020-12-10 (×15): qty 2

## 2020-12-10 MED ORDER — METHOCARBAMOL 500 MG PO TABS
1000.0000 mg | ORAL_TABLET | Freq: Three times a day (TID) | ORAL | Status: DC
Start: 2020-12-10 — End: 2020-12-14
  Administered 2020-12-10 – 2020-12-14 (×13): 1000 mg
  Filled 2020-12-10 (×13): qty 2

## 2020-12-10 MED ORDER — SODIUM CHLORIDE 0.9 % IV BOLUS
1000.0000 mL | Freq: Once | INTRAVENOUS | Status: AC
  Administered 2020-12-10: 1000 mL via INTRAVENOUS

## 2020-12-10 MED ORDER — KCL IN DEXTROSE-NACL 20-5-0.45 MEQ/L-%-% IV SOLN
INTRAVENOUS | Status: DC
  Filled 2020-12-10 (×3): qty 1000

## 2020-12-10 MED ORDER — DOCUSATE SODIUM 50 MG/5ML PO LIQD
100.0000 mg | Freq: Two times a day (BID) | ORAL | Status: DC
  Administered 2020-12-10 – 2020-12-13 (×7): 100 mg
  Filled 2020-12-10 (×8): qty 10

## 2020-12-10 MED ORDER — POTASSIUM CHLORIDE 20 MEQ PO PACK
40.0000 meq | PACK | Freq: Once | ORAL | Status: AC
  Administered 2020-12-10: 40 meq
  Filled 2020-12-10: qty 2

## 2020-12-10 MED ORDER — POTASSIUM CHLORIDE 10 MEQ/100ML IV SOLN
10.0000 meq | INTRAVENOUS | Status: AC
  Administered 2020-12-10 (×6): 10 meq via INTRAVENOUS
  Filled 2020-12-10 (×6): qty 100

## 2020-12-10 MED ORDER — POTASSIUM CHLORIDE 2 MEQ/ML IV SOLN: INTRAVENOUS | Status: DC

## 2020-12-10 NOTE — TOC CAGE-AID Note (Signed)
Transition of Care [facility]) - CAGE-AID Screening   Patient Details  Name: Morgan Beltran MRN: [id number] Date of Birth: 1995-12-27  Clinical Narrative:  Patient is intubated and sedated in ICU. Will follow up if needed on extubation.  CAGE-AID Screening: Substance Abuse Screening unable to be completed due to: : Patient unable to participate (patient intubated and sedated in ICU)
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
## 2020-12-10 NOTE — Progress Notes (Signed)
Called MD Byerly about 2100 BP of 85/39 after versed had been given due to agitation. 1000 mL NS bolus ordered.
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
## 2020-12-11 ENCOUNTER — Inpatient Hospital Stay (HOSPITAL_COMMUNITY): Payer: Medicaid Other

## 2020-12-11 ENCOUNTER — Inpatient Hospital Stay: Payer: Self-pay

## 2020-12-11 DIAGNOSIS — I639 Cerebral infarction, unspecified: Secondary | ICD-10-CM

## 2020-12-11 DIAGNOSIS — S060X9A Concussion with loss of consciousness of unspecified duration, initial encounter: Secondary | ICD-10-CM

## 2020-12-11 LAB — CBC
HCT: 28.7 % — ABNORMAL LOW (ref 36.0–46.0)
Hemoglobin: 9.5 g/dL — ABNORMAL LOW (ref 12.0–15.0)
MCH: 31.1 pg (ref 26.0–34.0)
MCHC: 33.1 g/dL (ref 30.0–36.0)
MCV: 94.1 fL (ref 80.0–100.0)
Platelets: 197 10*3/uL (ref 150–400)
RBC: 3.05 MIL/uL — ABNORMAL LOW (ref 3.87–5.11)
RDW: 12.3 % (ref 11.5–15.5)
WBC: 8.2 10*3/uL (ref 4.0–10.5)
nRBC: 0 % (ref 0.0–0.2)

## 2020-12-11 LAB — LIPID PANEL
Cholesterol: 95 mg/dL (ref 0–200)
HDL: 18 mg/dL — ABNORMAL LOW (ref >40)
LDL Cholesterol: 44 mg/dL (ref 0–99)
Total CHOL/HDL Ratio: 5.3 RATIO
Triglycerides: 164 mg/dL — ABNORMAL HIGH (ref <150)
VLDL: 33 mg/dL (ref 0–40)

## 2020-12-11 LAB — BASIC METABOLIC PANEL
Anion gap: 6 (ref 5–15)
BUN: <5 mg/dL — ABNORMAL LOW (ref 6–20)
CO2: 16 mmol/L — ABNORMAL LOW (ref 22–32)
Calcium: 7.5 mg/dL — ABNORMAL LOW (ref 8.9–10.3)
Chloride: 119 mmol/L — ABNORMAL HIGH (ref 98–111)
Creatinine, Ser: 0.78 mg/dL (ref 0.44–1.00)
GFR, Estimated: >60 mL/min (ref >60)
Glucose, Bld: 107 mg/dL — ABNORMAL HIGH (ref 70–99)
Potassium: 3.6 mmol/L (ref 3.5–5.1)
Sodium: 141 mmol/L (ref 135–145)

## 2020-12-11 LAB — GLUCOSE, CAPILLARY
Glucose-Capillary: 96 mg/dL (ref 70–99)
Glucose-Capillary: 90 mg/dL (ref 70–99)
Glucose-Capillary: 89 mg/dL (ref 70–99)
Glucose-Capillary: 130 mg/dL — ABNORMAL HIGH (ref 70–99)

## 2020-12-11 LAB — HEMOGLOBIN A1C
Hgb A1c MFr Bld: 5.2 % (ref 4.8–5.6)
Mean Plasma Glucose: 102.54 mg/dL

## 2020-12-11 IMAGING — DX DG ABD PORTABLE 1V
1 series · 1 of 1 positions shown · non-contrast
Comparison: None.

CLINICAL DATA: Feeding tube placement.

EXAM:
PORTABLE ABDOMEN - 1 VIEW

[abdomen]
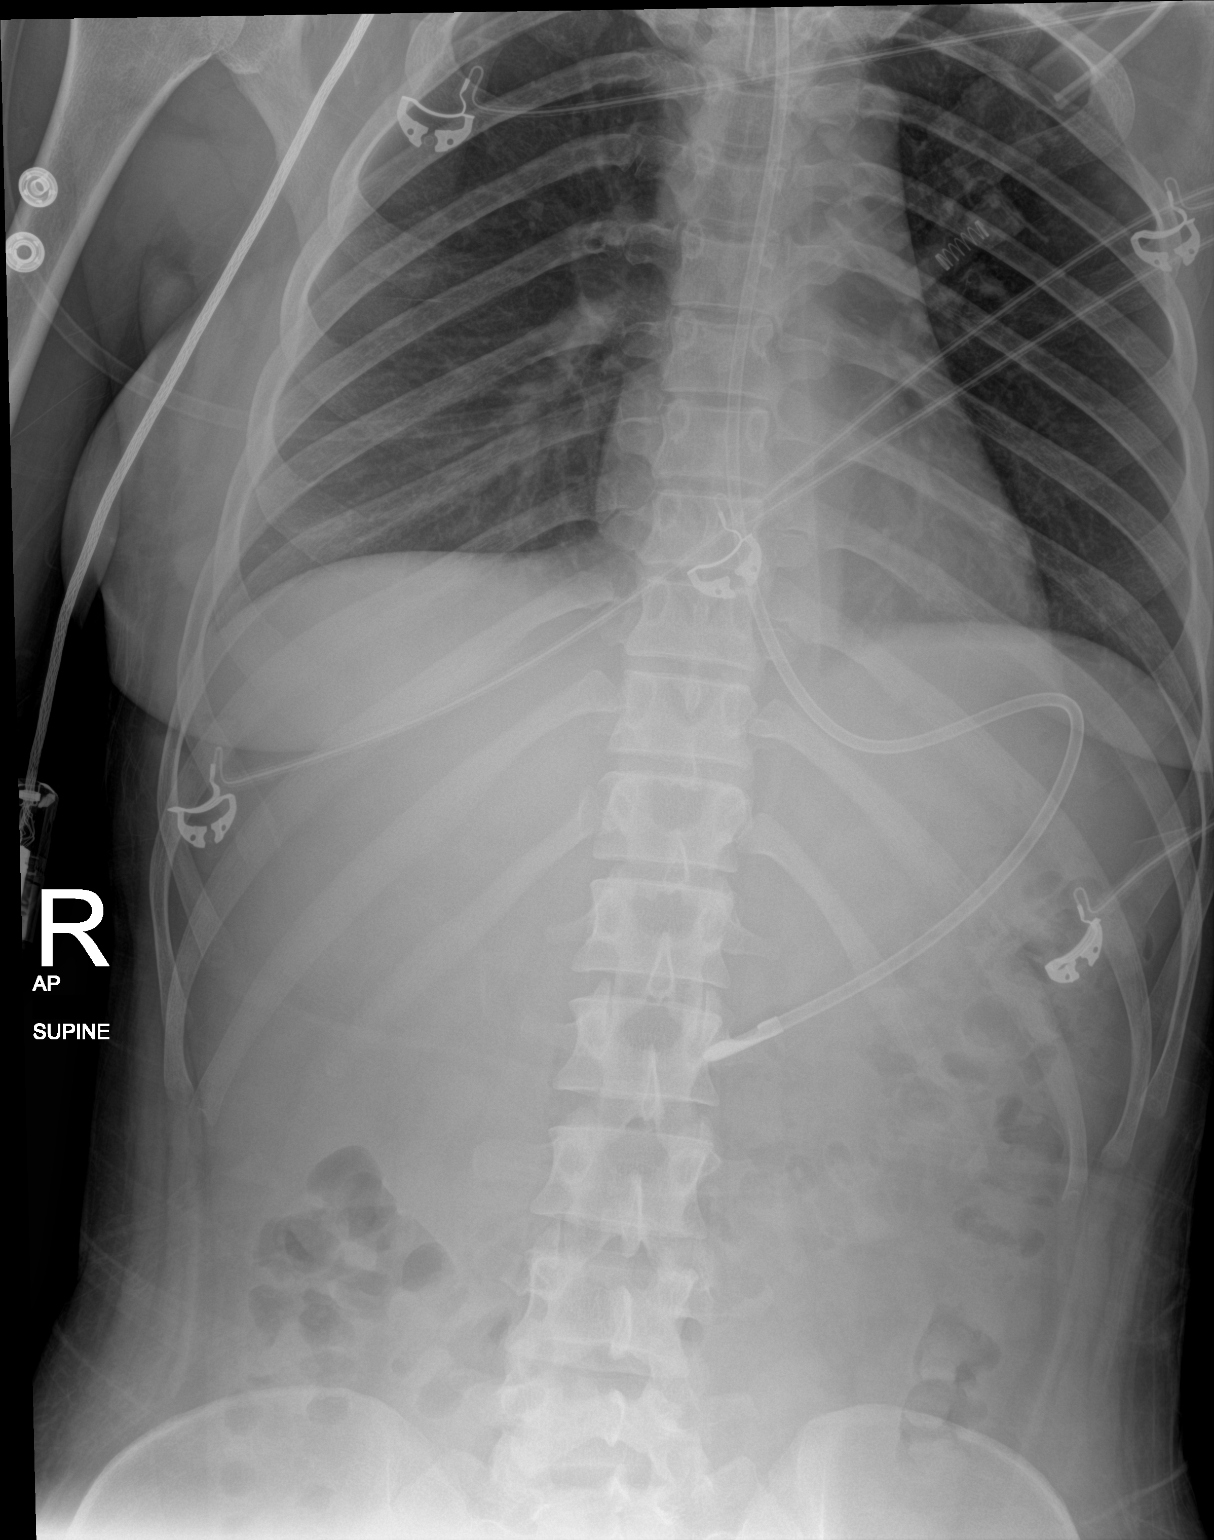

[1 of 1 positions shown; findings below may reference images not displayed]

FINDINGS: The bowel gas pattern is normal. Distal tip of feeding tube is seen
in expected position of stomach. No radio-opaque calculi or other
significant radiographic abnormality are seen.
IMPRESSION: Distal tip of feeding tube seen in expected position of stomach.
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
## 2020-12-11 MED ORDER — SODIUM CHLORIDE 0.9% FLUSH
10.0000 mL | Freq: Two times a day (BID) | INTRAVENOUS | Status: DC
  Administered 2020-12-11 – 2020-12-13 (×4): 10 mL

## 2020-12-11 MED ORDER — GADOBUTROL 1 MMOL/ML IV SOLN
7.0000 mL | Freq: Once | INTRAVENOUS | Status: AC | PRN
  Administered 2020-12-11: 7 mL via INTRAVENOUS

## 2020-12-11 MED ORDER — ASPIRIN 300 MG RE SUPP
300.0000 mg | Freq: Every day | RECTAL | Status: DC
  Filled 2020-12-11: qty 1

## 2020-12-11 MED ORDER — ASPIRIN 81 MG PO CHEW
81.0000 mg | CHEWABLE_TABLET | Freq: Every day | ORAL | Status: DC
  Filled 2020-12-11: qty 1

## 2020-12-11 MED ORDER — ASPIRIN 81 MG PO CHEW
81.0000 mg | CHEWABLE_TABLET | Freq: Every day | ORAL | Status: DC
  Administered 2020-12-11 – 2020-12-14 (×4): 81 mg
  Filled 2020-12-11 (×3): qty 1

## 2020-12-11 MED ORDER — PROSOURCE TF PO LIQD: 45.0000 mL | Freq: Two times a day (BID) | ORAL | Status: DC

## 2020-12-11 MED ORDER — SODIUM CHLORIDE 0.9 % IV BOLUS
1000.0000 mL | Freq: Once | INTRAVENOUS | Status: AC
  Administered 2020-12-11: 1000 mL via INTRAVENOUS

## 2020-12-11 MED ORDER — PANTOPRAZOLE 2 MG/ML SUSPENSION
40.0000 mg | Freq: Every day | ORAL | Status: DC
  Administered 2020-12-11 – 2020-12-12 (×2): 40 mg
  Filled 2020-12-11 (×2): qty 20

## 2020-12-11 MED ORDER — ASPIRIN 300 MG RE SUPP: 300.0000 mg | Freq: Every day | RECTAL | Status: DC

## 2020-12-11 MED ORDER — SODIUM CHLORIDE 0.9% FLUSH: 10.0000 mL | INTRAVENOUS | Status: DC | PRN

## 2020-12-11 MED ORDER — VITAL HIGH PROTEIN PO LIQD: 1000.0000 mL | ORAL | Status: DC

## 2020-12-11 MED ORDER — ALBUMIN HUMAN 5 % IV SOLN
25.0000 g | Freq: Once | INTRAVENOUS | Status: AC
  Administered 2020-12-11: 12.5 g via INTRAVENOUS
  Filled 2020-12-11: qty 500

## 2020-12-11 MED ORDER — PIVOT 1.5 CAL PO LIQD
1000.0000 mL | ORAL | Status: DC
  Administered 2020-12-11 – 2020-12-13 (×3): 1000 mL
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
## 2020-12-11 NOTE — Progress Notes (Deleted)
Peripherally Inserted Central Catheter Placement  The IV Nurse has discussed with the patient and/or persons authorized to consent for the patient, the purpose of this procedure and the potential benefits and risks involved with this procedure.  The benefits include less needle sticks, lab draws from the catheter, and the patient may be discharged home with the catheter. Risks include, but not limited to, infection, bleeding, blood clot (thrombus formation), and puncture of an artery; nerve damage and irregular heartbeat and possibility to perform a PICC exchange if needed/ordered by physician.  Alternatives to this procedure were also discussed.  Bard Power PICC patient education guide, fact sheet on infection prevention and patient information card has been provided to patient /or left at bedside.    PICC Placement Documentation  PICC Triple Lumen 12/11/20 PICC Left Basilic 44 cm 1 cm (Active)  Indication for Insertion or Continuance of Line Limited venous access - need for IV therapy >5 days (PICC only) 12/11/20 1500  Exposed Catheter (cm) 1 cm 12/11/20 1500  Site Assessment Clean;Dry;Intact 12/11/20 1500  Lumen #1 Status Flushed;Saline locked;Blood return noted 12/11/20 1500  Lumen #2 Status Flushed;Saline locked;Blood return noted 12/11/20 1500  Lumen #3 Status Flushed;Saline locked;Blood return noted 12/11/20 1500  Dressing Type Transparent;Securing device 12/11/20 1500  Dressing Status Clean;Dry;Intact 12/11/20 1500  Antimicrobial disc in place? Yes 12/11/20 1500  Safety Lock Not Applicable 12/11/20 1500  Line Care Connections checked and tightened 12/11/20 1500  Line Adjustment (NICU/IV Team Only) No 12/11/20 1500  Dressing Intervention New dressing 12/11/20 1500  Dressing Change Due 12/18/20 12/11/20 1500       Morgan Beltran 12/11/2020, 4:21 PM Peripherally Inserted Central Catheter Placement  The IV Nurse has discussed with the patient and/or persons authorized to consent for  the patient, the purpose of this procedure and the potential benefits and risks involved with this procedure.  The benefits include less needle sticks, lab draws from the catheter, and the patient may be discharged home with the catheter. Risks include, but not limited to, infection, bleeding, blood clot (thrombus formation), and puncture of an artery; nerve damage and irregular heartbeat and possibility to perform a PICC exchange if needed/ordered by physician.  Alternatives to this procedure were also discussed.  Bard Power PICC patient education guide, fact sheet on infection prevention and patient information card has been provided to patient /or left at bedside.    PICC Placement Documentation  PICC Triple Lumen 12/11/20 PICC Left Basilic 44 cm 1 cm (Active)  Indication for Insertion or Continuance of Line Limited venous access - need for IV therapy >5 days (PICC only) 12/11/20 1500  Exposed Catheter (cm) 1 cm 12/11/20 1500  Site Assessment Clean;Dry;Intact 12/11/20 1500  Lumen #1 Status Flushed;Saline locked;Blood return noted 12/11/20 1500  Lumen #2 Status Flushed;Saline locked;Blood return noted 12/11/20 1500  Lumen #3 Status Flushed;Saline locked;Blood return noted 12/11/20 1500  Dressing Type Transparent;Securing device 12/11/20 1500  Dressing Status Clean;Dry;Intact 12/11/20 1500  Antimicrobial disc in place? Yes 12/11/20 1500  Safety Lock Not Applicable 12/11/20 1500  Line Care Connections checked and tightened 12/11/20 1500  Line Adjustment (NICU/IV Team Only) No 12/11/20 1500  Dressing Intervention New dressing 12/11/20 1500  Dressing Change Due 12/18/20 12/11/20 1500       Morgan Beltran  Morgan Beltran 12/11/2020, 4:20 PM

## 2020-12-11 NOTE — Progress Notes (Signed)
During my morning assessment a neuro change was noted Dr. Bedelia Person was notified and a STAT head CT was ordered. Family was updated at the bedside. Will continue to monitor.

## 2020-12-11 NOTE — Procedures (Signed)
Cortrak  Person Inserting Tube:  Willadean Guyton L, RD Tube Type:  Cortrak - 43 inches Tube Size:  10 Tube Location:  Right nare Initial Placement:  Stomach Secured by: Bridle Technique Used to Measure Tube Placement:  Marking at nare/corner of mouth Cortrak Secured At:  66 cm   Cortrak Tube Team Note:  Consult received to place a Cortrak feeding tube.   X-ray is required, abdominal x-ray has been ordered by the Cortrak team. Please confirm tube placement before using the Cortrak tube.   If the tube becomes dislodged please keep the tube and contact the Cortrak team at [email or web address] ([email or web address]) for replacement.  If after hours and replacement cannot be delayed, place a NG tube and confirm placement with an abdominal x-ray.    Levon Penning BS, PLDN Clinical Dietitian See [email or web address] for contact information.    
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
## 2020-12-11 NOTE — Progress Notes (Signed)
RT NOTE: RT transported patient from room 4N18 to MRI and back to room 4N18 with no complications. Vitals are stable. RT will continue to monitor.
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
## 2020-12-11 NOTE — Progress Notes (Signed)
Peripherally Inserted Central Catheter Placement  The IV Nurse has discussed with the patient and/or persons authorized to consent for the patient, the purpose of this procedure and the potential benefits and risks involved with this procedure.  The benefits include less needle sticks, lab draws from the catheter, and the patient may be discharged home with the catheter. Risks include, but not limited to, infection, bleeding, blood clot (thrombus formation), and puncture of an artery; nerve damage and irregular heartbeat and possibility to perform a PICC exchange if needed/ordered by physician.  Alternatives to this procedure were also discussed.  Bard Power PICC patient education guide, fact sheet on infection prevention and patient information card has been provided to patient /or left at bedside. Consent obtained via bedside RN.     PICC Placement Documentation  PICC Triple Lumen 12/11/20 PICC Left Basilic 44 cm 1 cm (Active)  Indication for Insertion or Continuance of Line Limited venous access - need for IV therapy >5 days (PICC only) 12/11/20 1500  Exposed Catheter (cm) 1 cm 12/11/20 1500  Site Assessment Clean;Dry;Intact 12/11/20 1500  Lumen #1 Status Flushed;Saline locked;Blood return noted 12/11/20 1500  Lumen #2 Status Flushed;Saline locked;Blood return noted 12/11/20 1500  Lumen #3 Status Flushed;Saline locked;Blood return noted 12/11/20 1500  Dressing Type Transparent;Securing device 12/11/20 1500  Dressing Status Clean;Dry;Intact 12/11/20 1500  Antimicrobial disc in place? Yes 12/11/20 1500  Safety Lock Not Applicable 12/11/20 1500  Line Care Connections checked and tightened 12/11/20 1500  Line Adjustment (NICU/IV Team Only) No 12/11/20 1500  Dressing Intervention New dressing 12/11/20 1500  Dressing Change Due 12/18/20 12/11/20 1500       Morgan Beltran  [name] 12/11/2020, 4:15 PM

## 2020-12-11 NOTE — Progress Notes (Signed)
At 2100 Pt's BP dropped to 87/45 (58) with no recent fent boluses given; Dr. Andrey Campanile made aware and order was given of 1L NS bolus. Bolus began at 2154. BP 108/60, All other VS stable.   Jannifer Hick, RN 12/11/20 [phone]

## 2020-12-11 NOTE — Progress Notes (Signed)
Mother and Boyfriend Barbara Cower were updated by Neurology and myself. The family was tearful and understanding of the information given. Patient is stable at this time with no distress. Will continue to monitor.
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
## 2020-12-12 ENCOUNTER — Inpatient Hospital Stay (HOSPITAL_COMMUNITY): Payer: Medicaid Other

## 2020-12-12 DIAGNOSIS — I6389 Other cerebral infarction: Secondary | ICD-10-CM | POA: Diagnosis not present

## 2020-12-12 DIAGNOSIS — I6330 Cerebral infarction due to thrombosis of unspecified cerebral artery: Secondary | ICD-10-CM | POA: Insufficient documentation

## 2020-12-12 DIAGNOSIS — R569 Unspecified convulsions: Secondary | ICD-10-CM

## 2020-12-12 DIAGNOSIS — I63332 Cerebral infarction due to thrombosis of left posterior cerebral artery: Secondary | ICD-10-CM | POA: Diagnosis not present

## 2020-12-12 LAB — CBC
HCT: 29.4 % — ABNORMAL LOW (ref 36.0–46.0)
Hemoglobin: 10.1 g/dL — ABNORMAL LOW (ref 12.0–15.0)
MCH: 31.9 pg (ref 26.0–34.0)
MCHC: 34.4 g/dL (ref 30.0–36.0)
MCV: 92.7 fL (ref 80.0–100.0)
Platelets: 193 10*3/uL (ref 150–400)
RBC: 3.17 MIL/uL — ABNORMAL LOW (ref 3.87–5.11)
RDW: 12.2 % (ref 11.5–15.5)
WBC: 9.2 10*3/uL (ref 4.0–10.5)
nRBC: 0 % (ref 0.0–0.2)

## 2020-12-12 LAB — BASIC METABOLIC PANEL
Anion gap: 7 (ref 5–15)
BUN: 5 mg/dL — ABNORMAL LOW (ref 6–20)
CO2: 18 mmol/L — ABNORMAL LOW (ref 22–32)
Calcium: 7.5 mg/dL — ABNORMAL LOW (ref 8.9–10.3)
Chloride: 115 mmol/L — ABNORMAL HIGH (ref 98–111)
Creatinine, Ser: 0.73 mg/dL (ref 0.44–1.00)
GFR, Estimated: >60 mL/min (ref >60)
Glucose, Bld: 137 mg/dL — ABNORMAL HIGH (ref 70–99)
Potassium: 3.1 mmol/L — ABNORMAL LOW (ref 3.5–5.1)
Sodium: 140 mmol/L (ref 135–145)

## 2020-12-12 LAB — GLUCOSE, CAPILLARY
Glucose-Capillary: 160 mg/dL — ABNORMAL HIGH (ref 70–99)
Glucose-Capillary: 138 mg/dL — ABNORMAL HIGH (ref 70–99)
Glucose-Capillary: 90 mg/dL (ref 70–99)
Glucose-Capillary: 109 mg/dL — ABNORMAL HIGH (ref 70–99)
Glucose-Capillary: 93 mg/dL (ref 70–99)
Glucose-Capillary: 114 mg/dL — ABNORMAL HIGH (ref 70–99)

## 2020-12-12 LAB — ECHOCARDIOGRAM COMPLETE BUBBLE STUDY
Area-P 1/2: 4.1 cm2
Calc EF: 61 %
S' Lateral: 3 cm
Single Plane A2C EF: 57.6 %
Single Plane A4C EF: 61.7 %

## 2020-12-12 MED ORDER — ALBUMIN HUMAN 5 % IV SOLN
25.0000 g | Freq: Once | INTRAVENOUS | Status: AC
  Administered 2020-12-12: 25 g via INTRAVENOUS
  Filled 2020-12-12: qty 500

## 2020-12-12 MED ORDER — ORAL CARE MOUTH RINSE
15.0000 mL | Freq: Two times a day (BID) | OROMUCOSAL | Status: DC
  Administered 2020-12-13 – 2020-12-15 (×4): 15 mL via OROMUCOSAL

## 2020-12-12 MED ORDER — BETHANECHOL CHLORIDE 25 MG PO TABS
25.0000 mg | ORAL_TABLET | Freq: Three times a day (TID) | ORAL | Status: DC
  Administered 2020-12-12 – 2020-12-14 (×7): 25 mg
  Filled 2020-12-12 (×6): qty 1

## 2020-12-12 MED ORDER — POTASSIUM CHLORIDE 20 MEQ PO PACK
40.0000 meq | PACK | ORAL | Status: AC
Start: 2020-12-12 — End: 2020-12-12
  Administered 2020-12-12 (×2): 40 meq
  Filled 2020-12-12 (×2): qty 2

## 2020-12-12 MED ORDER — STROKE: EARLY STAGES OF RECOVERY BOOK: Freq: Once | Status: AC

## 2020-12-12 MED ORDER — BETHANECHOL CHLORIDE 25 MG PO TABS
25.0000 mg | ORAL_TABLET | Freq: Three times a day (TID) | ORAL | Status: DC
  Filled 2020-12-12: qty 1

## 2020-12-12 MED ORDER — CHLORHEXIDINE GLUCONATE 0.12 % MT SOLN
15.0000 mL | Freq: Two times a day (BID) | OROMUCOSAL | Status: DC
  Administered 2020-12-12 – 2020-12-15 (×6): 15 mL via OROMUCOSAL
  Filled 2020-12-12 (×5): qty 15

## 2020-12-12 NOTE — Progress Notes (Signed)
LTM maint complete 

## 2020-12-12 NOTE — Progress Notes (Signed)
LTM EEG hooked up and running - no initial skin breakdown - push button tested - neuro notified. [facility] monitoring.  

## 2020-12-12 NOTE — Procedures (Signed)
Extubation Procedure Note  Patient Details:   Name: Morgan Beltran DOB: 1996/01/20 MRN: [id number]   Airway Documentation:    Vent end date: 12/12/20 Vent end time: 1300   Evaluation  O2 sats: stable throughout Complications: No apparent complications Patient did tolerate procedure well. Bilateral Breath Sounds: Clear Rhonchi   Yes   Pt extubated to 4L [place] per MD order. Positive cuff leak noted, no stridor heard. Vitals stable throughout. RT will continue to monitor.   Memory Argue 12/12/2020, 1:03 PM

## 2020-12-12 NOTE — Progress Notes (Signed)
SLP Cancellation Note  Patient Details Name: Morgan Beltran MRN: [id number] DOB: Jan 05, 1996   Cancelled treatment:        Reason eval/treat not completed: Pt not medically ready - orally intubated and sedated. SLP will continue to follow for pt readiness.   Jeannie Done, SLP-Student   [name] 12/12/2020, 8:28 AM
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
## 2020-12-12 NOTE — Progress Notes (Signed)
At 0500 Pt BP 94/58 (70), retake was 91/54 (65). DR. Andrey Campanile made aware and order was given of 500 ml Albumin 5% be given. All other VS are stable at the moment.   Lafonda Mosses [facility] 12/12/20 [phone]

## 2020-12-12 NOTE — Progress Notes (Signed)
OT Cancellation Note  Patient Details Name: Morgan Beltran MRN: [id number] DOB: [birth year]   Cancelled Treatment:    Reason Eval/Treat Not Completed: Other (comment). Pt being extubated. Will follow up later time.   Thornell Mule, OT/L   Acute OT Clinical Specialist [facility] Pager [phone] Office [phone]  12/12/2020, 2:07 PM
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
## 2020-12-12 NOTE — Progress Notes (Signed)
Patient re-assessed and briskly, consistently f/c. Has been weaning all morning. Will plan to extubate with me present at bedside.   Additional critical care time:  Diamantina Monks, MD General and Trauma Surgery [facility]
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
## 2020-12-12 NOTE — Progress Notes (Signed)
Brief Neuro Note:  No seizures on LTM overnight, just slowing. If cEEG is negative for 24 hours, can discontinue. Will have stroke team evaluate patient for the occipital stroke.  Erick Blinks [facility] Pager Number [phone]

## 2020-12-13 DIAGNOSIS — I6330 Cerebral infarction due to thrombosis of unspecified cerebral artery: Secondary | ICD-10-CM

## 2020-12-13 DIAGNOSIS — R4182 Altered mental status, unspecified: Secondary | ICD-10-CM

## 2020-12-13 DIAGNOSIS — R569 Unspecified convulsions: Secondary | ICD-10-CM | POA: Diagnosis not present

## 2020-12-13 LAB — CBC
HCT: 23.2 % — ABNORMAL LOW (ref 36.0–46.0)
Hemoglobin: 8 g/dL — ABNORMAL LOW (ref 12.0–15.0)
MCH: 31.7 pg (ref 26.0–34.0)
MCHC: 34.5 g/dL (ref 30.0–36.0)
MCV: 92.1 fL (ref 80.0–100.0)
Platelets: 189 10*3/uL (ref 150–400)
RBC: 2.52 MIL/uL — ABNORMAL LOW (ref 3.87–5.11)
RDW: 12.3 % (ref 11.5–15.5)
WBC: 7.1 10*3/uL (ref 4.0–10.5)
nRBC: 0 % (ref 0.0–0.2)

## 2020-12-13 LAB — LIPID PANEL
Cholesterol: 105 mg/dL (ref 0–200)
HDL: 23 mg/dL — ABNORMAL LOW (ref >40)
LDL Cholesterol: 65 mg/dL (ref 0–99)
Total CHOL/HDL Ratio: 4.6 RATIO
Triglycerides: 83 mg/dL (ref <150)
VLDL: 17 mg/dL (ref 0–40)

## 2020-12-13 LAB — GLUCOSE, CAPILLARY
Glucose-Capillary: 139 mg/dL — ABNORMAL HIGH (ref 70–99)
Glucose-Capillary: 125 mg/dL — ABNORMAL HIGH (ref 70–99)
Glucose-Capillary: 117 mg/dL — ABNORMAL HIGH (ref 70–99)
Glucose-Capillary: 76 mg/dL (ref 70–99)
Glucose-Capillary: 130 mg/dL — ABNORMAL HIGH (ref 70–99)
Glucose-Capillary: 130 mg/dL — ABNORMAL HIGH (ref 70–99)
Glucose-Capillary: 125 mg/dL — ABNORMAL HIGH (ref 70–99)

## 2020-12-13 LAB — BASIC METABOLIC PANEL
Anion gap: 4 — ABNORMAL LOW (ref 5–15)
BUN: 8 mg/dL (ref 6–20)
CO2: 19 mmol/L — ABNORMAL LOW (ref 22–32)
Calcium: 7.2 mg/dL — ABNORMAL LOW (ref 8.9–10.3)
Chloride: 116 mmol/L — ABNORMAL HIGH (ref 98–111)
Creatinine, Ser: 0.48 mg/dL (ref 0.44–1.00)
GFR, Estimated: >60 mL/min (ref >60)
Glucose, Bld: 134 mg/dL — ABNORMAL HIGH (ref 70–99)
Potassium: 3.3 mmol/L — ABNORMAL LOW (ref 3.5–5.1)
Sodium: 139 mmol/L (ref 135–145)

## 2020-12-13 LAB — HEMOGLOBIN A1C
Hgb A1c MFr Bld: 5.2 % (ref 4.8–5.6)
Mean Plasma Glucose: 102.54 mg/dL

## 2020-12-13 LAB — MAGNESIUM: Magnesium: 1.6 mg/dL — ABNORMAL LOW (ref 1.7–2.4)

## 2020-12-13 LAB — TRIGLYCERIDES: Triglycerides: 84 mg/dL (ref <150)

## 2020-12-13 MED ORDER — MAGNESIUM SULFATE 50 % IJ SOLN
3.0000 g | Freq: Once | INTRAVENOUS | Status: AC
  Administered 2020-12-13: 3 g via INTRAVENOUS
  Filled 2020-12-13: qty 6

## 2020-12-13 MED ORDER — POTASSIUM CHLORIDE 20 MEQ PO PACK
40.0000 meq | PACK | ORAL | Status: AC
  Administered 2020-12-13 (×2): 40 meq
  Filled 2020-12-13 (×2): qty 2
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
## 2020-12-13 NOTE — Progress Notes (Signed)
Stopped cEEG study.  Notified [facility] monitoring.  No skin breakdown observed.

## 2020-12-13 NOTE — Progress Notes (Signed)
PICC d/c'd per order per policy.  Manual pressure applied for 5 minutes with vaseline and 4x4 pressure dressing applied.  No bleeding post removal.  Instructed to notify RN if bleeding is noted.  Enrique Sack, RN aware.  Out of bed time is 2045.  Patient tolerated well.
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
## 2020-12-14 ENCOUNTER — Encounter (HOSPITAL_COMMUNITY): Payer: Medicaid Other

## 2020-12-14 DIAGNOSIS — I6389 Other cerebral infarction: Secondary | ICD-10-CM | POA: Diagnosis not present

## 2020-12-14 DIAGNOSIS — I9589 Other hypotension: Secondary | ICD-10-CM

## 2020-12-14 DIAGNOSIS — S43101A Unspecified dislocation of right acromioclavicular joint, initial encounter: Secondary | ICD-10-CM | POA: Diagnosis not present

## 2020-12-14 LAB — BASIC METABOLIC PANEL
Anion gap: 7 (ref 5–15)
BUN: 16 mg/dL (ref 6–20)
CO2: 19 mmol/L — ABNORMAL LOW (ref 22–32)
Calcium: 9 mg/dL (ref 8.9–10.3)
Chloride: 112 mmol/L — ABNORMAL HIGH (ref 98–111)
Creatinine, Ser: 0.64 mg/dL (ref 0.44–1.00)
GFR, Estimated: >60 mL/min (ref >60)
Glucose, Bld: 149 mg/dL — ABNORMAL HIGH (ref 70–99)
Potassium: 3.9 mmol/L (ref 3.5–5.1)
Sodium: 138 mmol/L (ref 135–145)

## 2020-12-14 LAB — MAGNESIUM: Magnesium: 2.4 mg/dL (ref 1.7–2.4)

## 2020-12-14 LAB — GLUCOSE, CAPILLARY
Glucose-Capillary: 102 mg/dL — ABNORMAL HIGH (ref 70–99)
Glucose-Capillary: 103 mg/dL — ABNORMAL HIGH (ref 70–99)
Glucose-Capillary: 110 mg/dL — ABNORMAL HIGH (ref 70–99)
Glucose-Capillary: 133 mg/dL — ABNORMAL HIGH (ref 70–99)
Glucose-Capillary: 138 mg/dL — ABNORMAL HIGH (ref 70–99)
Glucose-Capillary: 154 mg/dL — ABNORMAL HIGH (ref 70–99)

## 2020-12-14 LAB — CBC
HCT: 34.6 % — ABNORMAL LOW (ref 36.0–46.0)
Hemoglobin: 11.7 g/dL — ABNORMAL LOW (ref 12.0–15.0)
MCH: 30.9 pg (ref 26.0–34.0)
MCHC: 33.8 g/dL (ref 30.0–36.0)
MCV: 91.3 fL (ref 80.0–100.0)
Platelets: 322 10*3/uL (ref 150–400)
RBC: 3.79 MIL/uL — ABNORMAL LOW (ref 3.87–5.11)
RDW: 12.2 % (ref 11.5–15.5)
WBC: 9.5 10*3/uL (ref 4.0–10.5)
nRBC: 0 % (ref 0.0–0.2)

## 2020-12-14 MED ORDER — BETHANECHOL CHLORIDE 25 MG PO TABS: 25.0000 mg | ORAL_TABLET | Freq: Three times a day (TID) | ORAL | Status: DC

## 2020-12-14 MED ORDER — DOCUSATE SODIUM 100 MG PO CAPS
100.0000 mg | ORAL_CAPSULE | Freq: Two times a day (BID) | ORAL | Status: DC
  Administered 2020-12-14: 100 mg via ORAL
  Filled 2020-12-14 (×2): qty 1

## 2020-12-14 MED ORDER — ASPIRIN EC 81 MG PO TBEC
81.0000 mg | DELAYED_RELEASE_TABLET | Freq: Every day | ORAL | Status: DC
  Administered 2020-12-15: 81 mg via ORAL
  Filled 2020-12-14: qty 1

## 2020-12-14 MED ORDER — METHOCARBAMOL 500 MG PO TABS
1000.0000 mg | ORAL_TABLET | Freq: Three times a day (TID) | ORAL | Status: DC
  Administered 2020-12-14 – 2020-12-15 (×4): 1000 mg via ORAL
  Filled 2020-12-14 (×4): qty 2

## 2020-12-14 MED ORDER — POLYETHYLENE GLYCOL 3350 17 G PO PACK
17.0000 g | PACK | Freq: Every day | ORAL | Status: DC
  Filled 2020-12-14: qty 1

## 2020-12-14 MED ORDER — ENSURE ENLIVE PO LIQD
237.0000 mL | Freq: Two times a day (BID) | ORAL | Status: DC
  Administered 2020-12-14: 237 mL via ORAL

## 2020-12-14 MED ORDER — ACETAMINOPHEN 500 MG PO TABS
1000.0000 mg | ORAL_TABLET | Freq: Four times a day (QID) | ORAL | Status: DC
  Administered 2020-12-14 – 2020-12-15 (×5): 1000 mg via ORAL
  Filled 2020-12-14 (×5): qty 2

## 2020-12-14 MED ORDER — ACETAMINOPHEN 160 MG/5ML PO SOLN
1000.0000 mg | Freq: Four times a day (QID) | ORAL | Status: DC
  Administered 2020-12-14: 1000 mg
  Filled 2020-12-14: qty 40.6
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
## 2020-12-14 NOTE — Progress Notes (Addendum)
[facility] IP rehab admissions - I met briefly with patient this morning.  She expressed her desire to go home and not come to rehab here in the hospital.  fI did open the case and check benefits in case patient changed her mind about CIR admission.  I met with patient's mother this afternoon.  Mother has a lot of help at home and says that she is comfortable with taking patient home as soon as patient is stable.  Patient has a son at home who is struggling with patient not being at home.  Mom stays at home and provides care for several family members.  Mom is not interested in CIR at this time.  We will follow along for progress.  Call for questions.  [phone]
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
## 2020-12-15 ENCOUNTER — Encounter: Payer: Self-pay | Admitting: Obstetrics & Gynecology

## 2020-12-15 ENCOUNTER — Other Ambulatory Visit (HOSPITAL_COMMUNITY): Payer: Self-pay

## 2020-12-15 LAB — GLUCOSE, CAPILLARY
Glucose-Capillary: 103 mg/dL — ABNORMAL HIGH (ref 70–99)
Glucose-Capillary: 96 mg/dL (ref 70–99)

## 2020-12-15 MED ORDER — ASPIRIN 81 MG PO TBEC
81.0000 mg | DELAYED_RELEASE_TABLET | Freq: Every day | ORAL | 3 refills | Status: DC
  Filled 2020-12-15: qty 30, 30d supply, fill #0

## 2020-12-15 MED ORDER — DOCUSATE SODIUM 100 MG PO CAPS: 100.0000 mg | ORAL_CAPSULE | Freq: Two times a day (BID) | ORAL | 0 refills | Status: DC

## 2020-12-15 MED ORDER — ACETAMINOPHEN 500 MG PO TABS: 1000.0000 mg | ORAL_TABLET | Freq: Three times a day (TID) | ORAL | 0 refills | Status: DC | PRN

## 2020-12-15 MED ORDER — POLYETHYLENE GLYCOL 3350 17 G PO PACK: 17.0000 g | PACK | Freq: Every day | ORAL | 0 refills | Status: DC

## 2020-12-15 MED ORDER — OXYCODONE HCL 5 MG PO TABS
5.0000 mg | ORAL_TABLET | Freq: Four times a day (QID) | ORAL | 0 refills | Status: DC | PRN
  Filled 2020-12-15: qty 15, 4d supply, fill #0

## 2020-12-15 MED ORDER — METHOCARBAMOL 500 MG PO TABS
1000.0000 mg | ORAL_TABLET | Freq: Four times a day (QID) | ORAL | 0 refills | Status: DC | PRN
  Filled 2020-12-15: qty 60, 8d supply, fill #0

## 2020-12-15 NOTE — Discharge Instructions (Signed)
Orthopedics is recommending sling with limiting overhead activities and reaching behind for your [facility] seperation. I have attached a handout.  Please follow up with Orthopedics

## 2020-12-15 NOTE — TOC Transition Note (Signed)
Transition of Care [facility]) - CM/SW Discharge Note   Patient Details  Name: Morgan Beltran MRN: [id number] Date of Birth: [birth year]  Transition of Care [facility]) CM/SW Contact:  Ella Bodo, RN Phone Number: 12/15/2020, 12:17 PM   Clinical Narrative:    Pt/family declining inpatient rehab, as recommended by PT/OT.  Met with pt and family to discuss discharge arrangements.  They prefer OP therapies at discharge; will refer to [facility] for follow up PT/OT and ST.  Referral to [facility] for WC, RW, and 3 in 1, to be delivered to bedside prior to dc.  Patient will have 24h supervision/care provided by significant other and family.    Final next level of care: OP Rehab Barriers to Discharge: Barriers Resolved   Patient Goals and CMS Choice Patient states their goals for this hospitalization and ongoing recovery are:: to go home                            Discharge Plan and Services   Discharge Planning Services: CM Consult            DME Arranged: 3-N-1, Walker rolling, Wheelchair manual DME Agency: [employer] Date DME Agency Contacted: 12/15/20 Time DME Agency Contacted: 1216 Representative spoke with at DME Agency: [place] Determinants of Health ([place]) Interventions     Readmission Risk Interventions No flowsheet data found.  Reinaldo Raddle, RN, BSN  Trauma/Neuro ICU Case Manager [phone]
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
## 2020-12-15 NOTE — Progress Notes (Signed)
Patient suffers from stroke which impairs their ability to perform daily activities like bathing, dressing, grooming, and toileting in the home.  A cane, crutch, or walker will not resolve issue with performing activities of daily living. A wheelchair will allow patient to safely perform daily activities. Patient can safely propel the wheelchair in the home or has a caregiver who can provide assistance. Length of need 6 months . Accessories: elevating leg rests (ELRs), wheel locks, extensions and anti-tippers.

## 2020-12-15 NOTE — Progress Notes (Signed)
Inpatient Rehab Admissions Coordinator:   Pt. Continues to decline CIR, states she is going home. CIR will sign off.   Megan Salon, MS, CCC-SLP Rehab Admissions Coordinator  [phone] (celll) [phone] (office)

## 2020-12-15 NOTE — Plan of Care (Signed)
Patient is going home with equipment and outpatient therapies
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
## 2020-12-22 ENCOUNTER — Ambulatory Visit (INDEPENDENT_AMBULATORY_CARE_PROVIDER_SITE_OTHER): Payer: Medicaid Other | Admitting: Orthopaedic Surgery

## 2020-12-22 ENCOUNTER — Other Ambulatory Visit: Payer: Self-pay

## 2020-12-22 DIAGNOSIS — S43101A Unspecified dislocation of right acromioclavicular joint, initial encounter: Secondary | ICD-10-CM | POA: Diagnosis not present
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
## 2020-12-26 ENCOUNTER — Ambulatory Visit: Payer: Medicaid Other | Admitting: Obstetrics & Gynecology

## 2020-12-27 ENCOUNTER — Encounter: Payer: Self-pay | Admitting: Speech Pathology

## 2020-12-27 ENCOUNTER — Ambulatory Visit: Payer: Medicaid Other | Attending: Physician Assistant | Admitting: Rehabilitation

## 2020-12-27 ENCOUNTER — Ambulatory Visit: Payer: Medicaid Other | Admitting: Occupational Therapy

## 2020-12-27 ENCOUNTER — Encounter: Payer: Self-pay | Admitting: Occupational Therapy

## 2020-12-27 ENCOUNTER — Other Ambulatory Visit: Payer: Self-pay

## 2020-12-27 ENCOUNTER — Encounter: Payer: Self-pay | Admitting: Rehabilitation

## 2020-12-27 ENCOUNTER — Ambulatory Visit: Payer: Medicaid Other | Admitting: Speech Pathology

## 2020-12-27 DIAGNOSIS — I6330 Cerebral infarction due to thrombosis of unspecified cerebral artery: Secondary | ICD-10-CM | POA: Insufficient documentation

## 2020-12-27 DIAGNOSIS — R278 Other lack of coordination: Secondary | ICD-10-CM

## 2020-12-27 DIAGNOSIS — M6281 Muscle weakness (generalized): Secondary | ICD-10-CM

## 2020-12-27 DIAGNOSIS — R41844 Frontal lobe and executive function deficit: Secondary | ICD-10-CM

## 2020-12-27 DIAGNOSIS — R41842 Visuospatial deficit: Secondary | ICD-10-CM | POA: Diagnosis not present

## 2020-12-27 DIAGNOSIS — M25511 Pain in right shoulder: Secondary | ICD-10-CM | POA: Insufficient documentation

## 2020-12-27 DIAGNOSIS — R41841 Cognitive communication deficit: Secondary | ICD-10-CM

## 2020-12-27 DIAGNOSIS — R208 Other disturbances of skin sensation: Secondary | ICD-10-CM | POA: Insufficient documentation

## 2020-12-27 DIAGNOSIS — R41840 Attention and concentration deficit: Secondary | ICD-10-CM

## 2020-12-27 DIAGNOSIS — R293 Abnormal posture: Secondary | ICD-10-CM

## 2020-12-27 DIAGNOSIS — R2689 Other abnormalities of gait and mobility: Secondary | ICD-10-CM | POA: Diagnosis not present
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
## 2021-01-02 ENCOUNTER — Other Ambulatory Visit: Payer: Self-pay

## 2021-01-02 ENCOUNTER — Ambulatory Visit (INDEPENDENT_AMBULATORY_CARE_PROVIDER_SITE_OTHER): Payer: Medicaid Other | Admitting: Orthopaedic Surgery

## 2021-01-02 ENCOUNTER — Ambulatory Visit (HOSPITAL_BASED_OUTPATIENT_CLINIC_OR_DEPARTMENT_OTHER): Payer: Self-pay | Admitting: Orthopaedic Surgery

## 2021-01-02 ENCOUNTER — Other Ambulatory Visit (HOSPITAL_BASED_OUTPATIENT_CLINIC_OR_DEPARTMENT_OTHER): Payer: Self-pay

## 2021-01-02 DIAGNOSIS — S43101A Unspecified dislocation of right acromioclavicular joint, initial encounter: Secondary | ICD-10-CM | POA: Diagnosis not present

## 2021-01-02 MED ORDER — ASPIRIN EC 325 MG PO TBEC
325.0000 mg | DELAYED_RELEASE_TABLET | Freq: Every day | ORAL | 0 refills | Status: DC
  Filled 2021-01-02: qty 30, 30d supply, fill #0

## 2021-01-02 MED ORDER — IBUPROFEN 800 MG PO TABS
800.0000 mg | ORAL_TABLET | Freq: Three times a day (TID) | ORAL | 0 refills | Status: AC
  Filled 2021-01-02: qty 30, 10d supply, fill #0

## 2021-01-02 MED ORDER — ACETAMINOPHEN 500 MG PO TABS
500.0000 mg | ORAL_TABLET | Freq: Three times a day (TID) | ORAL | 0 refills | Status: AC
  Filled 2021-01-02: qty 30, 10d supply, fill #0

## 2021-01-02 MED ORDER — OXYCODONE HCL 5 MG PO TABS
5.0000 mg | ORAL_TABLET | ORAL | 0 refills | Status: DC | PRN
  Filled 2021-01-02: qty 20, 4d supply, fill #0

## 2021-01-02 NOTE — Patient Instructions (Signed)
Disability and Out-of-Work Paperwork  If you would like to file disability (short term and/or and long term), FMLA, or other out-of-work paperwork, please mail, hand deliver, or fax the paperwork to our main office:   [facility]  [address]  , [place] [address] Phone: [phone] Fax: [phone]  We are unable to complete these forms in our [facility] satellite office and want to ensure your paperwork is filed in an appropriate and proper manner.   Thank you for understanding.   Pre-Operative Appointment  Post (after) Operative Medications:   You were likely prescribed 4 medications:  A. Acetaminophen (Tylenol) 500 MG B. Ibuprofen (Advil) 800 MG C. Oxycodone 5 MG D. Aspirin 325 MG  These medications do not need to be taken until AFTER  surgery. All medications should be taken according to prescription, pharmacy and prescriber direction.   These medications can be picked up from the [facility] on the first floor of [facility] immediately after your appointment.   Aspirin should be taken once a day for 30 days following surgery to prevent complications, such as blood clots.   Oxycodone is an opioid and should be used for breakthrough pain.   Acetaminophen and Ibuprofen should be used intermittently for pain control.    2. Surgery Scheduling  Our surgery scheduler, Debbie Nardi, will be in contact with you to schedule your surgery. She will give you details on the day, time and the location your surgery will be performed. Debbie Nardi will also schedule your first post-operative appointment 2 weeks after your scheduled surgery date. This appointment will be in our office at [facility], not the location where surgery is performed.   Debbie Nardi  [phone] Phone [phone] Fax [email or web address]  Closer to the date of your surgery, a nurse  from the hospital or surgery center will contact you about specific details prior to surgery such as when to cease eating, when to arrive at the facility, and what you can expect the day of surgery.    3. Durable Medical Equipment:  If you were given a medical device at your appointment such as, shoulder sling, post operative knee brace, or walking boot, you will bring that to the hospital/surgery center with you when you check in the day of surgery.   Be sure to inform the pre-operative staff that the equipment should go with you to the operating room.   You will wake up after surgery with the equipment on and will continue to wear it as explained by Dr. Bokshan.    Physical Therapy After Surgery  We have sent a referral to the physical therapy office in the [facility], located on the first floor within the [facility] center.   If you have an active MyChart account, look for an invitation via MyChart to schedule your Physical Therapy evaluation. If you do not have an active mychart, [facility] will reach out within 3 business days. Feel free to contact them directly to schedule your evaluation at [phone].   If you are having surgery, you may still use mychart to schedule your evaluation but please also call to preschedule your follow up appointments.   Please schedule your evaluation within 3-5 days of your surgery if you choose to use MyChart or contact the physical therapy office directly.   If you have any questions about physical therapy you may contact Jessica Hightower, PT,   DPT at [phone].
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
## 2021-01-04 ENCOUNTER — Other Ambulatory Visit (HOSPITAL_BASED_OUTPATIENT_CLINIC_OR_DEPARTMENT_OTHER): Payer: Self-pay | Admitting: Orthopaedic Surgery

## 2021-01-05 ENCOUNTER — Ambulatory Visit (HOSPITAL_BASED_OUTPATIENT_CLINIC_OR_DEPARTMENT_OTHER): Payer: Medicaid Other | Admitting: Orthopaedic Surgery

## 2021-01-17 ENCOUNTER — Ambulatory Visit: Payer: Medicaid Other | Admitting: Occupational Therapy

## 2021-01-17 ENCOUNTER — Encounter: Payer: Self-pay | Admitting: Physical Therapy

## 2021-01-17 ENCOUNTER — Ambulatory Visit: Payer: Medicaid Other | Admitting: Physical Therapy

## 2021-01-17 ENCOUNTER — Encounter: Payer: Self-pay | Admitting: Occupational Therapy

## 2021-01-17 ENCOUNTER — Ambulatory Visit: Payer: Medicaid Other

## 2021-01-17 ENCOUNTER — Other Ambulatory Visit: Payer: Self-pay

## 2021-01-17 DIAGNOSIS — M6281 Muscle weakness (generalized): Secondary | ICD-10-CM

## 2021-01-17 DIAGNOSIS — R293 Abnormal posture: Secondary | ICD-10-CM

## 2021-01-17 DIAGNOSIS — R41841 Cognitive communication deficit: Secondary | ICD-10-CM

## 2021-01-17 DIAGNOSIS — R41840 Attention and concentration deficit: Secondary | ICD-10-CM

## 2021-01-17 DIAGNOSIS — R2689 Other abnormalities of gait and mobility: Secondary | ICD-10-CM

## 2021-01-17 DIAGNOSIS — R278 Other lack of coordination: Secondary | ICD-10-CM

## 2021-01-17 DIAGNOSIS — M25511 Pain in right shoulder: Secondary | ICD-10-CM

## 2021-01-17 NOTE — Patient Instructions (Addendum)
Look for the notebook with your study materials. Review this materials   Apps: Constant Therapy (free for 14 days) and TalkPath   Codes: write down the most frequent codes       Maps: write out the general steps to the local hospitals in area
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
## 2021-01-17 NOTE — Patient Instructions (Signed)
Access Code: [email or web address] URL: [email or web address] Date: 01/17/2021 Prepared by: Sallyanne Kuster  Exercises Sit to Stand with Arms Crossed - 1 x daily - 5 x weekly - 1 sets - 10 reps Standing Single Leg Stance with Unilateral Counter Support - 1 x daily - 5 x weekly - 1 sets - 3 reps - 10 seconds hold Standing Tandem Balance with Unilateral Counter Support - 1 x daily - 5 x weekly - 1 sets - 3 reps - 20 secondes hold Standing Near Stance in Corner with Eyes Closed - 1 x daily - 5 x weekly - 1 sets - 3 reps - 30 seconds hold Standing Balance in Corner with Eyes Closed - 1 x daily - 5 x weekly - 1 sets - 10 reps

## 2021-01-17 NOTE — Therapy (Signed)
[facility] [address] [place], [place], [address] Phone: [phone]   Fax:  [phone]  Occupational Therapy Treatment  Patient Details  Name: Morgan Beltran MRN: [id number] Date of Birth: Jun 15, 1995 Referring Provider (OT): [place], [place] follow up with Ihor Austin, NP   Encounter Date: 01/17/2021   OT End of Session - 01/17/21 1304     Visit Number 2    Number of Visits 13    Date for OT Re-Evaluation 03/22/21    Authorization Type [employer]    Authorization Time Period $4 Copay, auth req'd AFTER 12th visit    Authorization - Number of Visits 12    OT Start Time 1230    OT Stop Time 1250    OT Time Calculation (min) 20 min    Activity Tolerance Patient tolerated treatment well    Behavior During Therapy Flat affect             Past Medical History:  Diagnosis Date   Asthma    childhood asthma, no inaher, no prob as adult   Cervicitis 2014   SVD (spontaneous vaginal delivery)    x 1    Past Surgical History:  Procedure Laterality Date   DILATION AND EVACUATION N/A 06/27/2017   Procedure: DILATATION AND EVACUATION;  Surgeon: Tereso Newcomer, MD;  Location: [facility];  Service: Gynecology;  Laterality: N/A;   NO PAST SURGERIES     WISDOM TOOTH EXTRACTION     WISDOM TOOTH EXTRACTION  2012    There were no vitals filed for this visit.   Subjective Assessment - 01/17/21 1234     Subjective  Patient reports pain 7/10 at top of shoulder [facility] joint    Patient is accompanied by: Family member   mom   Pertinent History None    Limitations Fall Risk, Labile, Poor frustration tolerance, NWB RUE    Patient Stated Goals "wish my arm was better"    Currently in Pain? Yes    Pain Score 7     Pain Location Shoulder    Pain Orientation Right    Pain Descriptors / Indicators Constant;Aching    Pain Type Acute pain    Pain Onset 1 to 4 weeks ago    Pain Frequency Constant    Aggravating Factors   Taking care of kids, riding in truck long pics    Pain Relieving Factors pain meds                          OT Treatments/Exercises (OP) - 01/17/21 1259       ADLs   ADL Comments Reviewed with patient and her mom that we will hold OT for now until agfter surgery.  Patient sees Neuro next week to be cleared for [facility] joint repair.  Hopefully patient will retunr following surgery to address RUE function/range of motion/strength.  Reviewed forearm, wrist and hand exercises now.  Patient reports wearing immobilizer inconsistently at this time.  She indicates that it does not help much with shoulder pain - reinforced benefit of wearing to prevent further joint dislocation/pain.                    OT Education - 01/17/21 1303     Education Details hand, wrist, forearm AROM whil in immobilizer to prevent swelling, stiffness    Person(s) Educated Patient;Parent(s)    Methods Explanation;Demonstration    Comprehension Verbalized understanding;Returned demonstration  [facility] [address] [place], [place], [address] Phone: [phone]   Fax:  [phone]  Occupational Therapy Treatment  Patient Details  Name: Morgan Beltran MRN: [id number] Date of Birth: Jun 15, 1995 Referring Provider (OT): [place], [place] follow up with Ihor Austin, NP   Encounter Date: 01/17/2021   OT End of Session - 01/17/21 1304     Visit Number 2    Number of Visits 13    Date for OT Re-Evaluation 03/22/21    Authorization Type [employer]    Authorization Time Period $4 Copay, auth req'd AFTER 12th visit    Authorization - Number of Visits 12    OT Start Time 1230    OT Stop Time 1250    OT Time Calculation (min) 20 min    Activity Tolerance Patient tolerated treatment well    Behavior During Therapy Flat affect             Past Medical History:  Diagnosis Date   Asthma    childhood asthma, no inaher, no prob as adult   Cervicitis 2014   SVD (spontaneous vaginal delivery)    x 1    Past Surgical History:  Procedure Laterality Date   DILATION AND EVACUATION N/A 06/27/2017   Procedure: DILATATION AND EVACUATION;  Surgeon: Tereso Newcomer, MD;  Location: [facility];  Service: Gynecology;  Laterality: N/A;   NO PAST SURGERIES     WISDOM TOOTH EXTRACTION     WISDOM TOOTH EXTRACTION  2012    There were no vitals filed for this visit.   Subjective Assessment - 01/17/21 1234     Subjective  Patient reports pain 7/10 at top of shoulder [facility] joint    Patient is accompanied by: Family member   mom   Pertinent History None    Limitations Fall Risk, Labile, Poor frustration tolerance, NWB RUE    Patient Stated Goals "wish my arm was better"    Currently in Pain? Yes    Pain Score 7     Pain Location Shoulder    Pain Orientation Right    Pain Descriptors / Indicators Constant;Aching    Pain Type Acute pain    Pain Onset 1 to 4 weeks ago    Pain Frequency Constant    Aggravating Factors   Taking care of kids, riding in truck long pics    Pain Relieving Factors pain meds                          OT Treatments/Exercises (OP) - 01/17/21 1259       ADLs   ADL Comments Reviewed with patient and her mom that we will hold OT for now until agfter surgery.  Patient sees Neuro next week to be cleared for [facility] joint repair.  Hopefully patient will retunr following surgery to address RUE function/range of motion/strength.  Reviewed forearm, wrist and hand exercises now.  Patient reports wearing immobilizer inconsistently at this time.  She indicates that it does not help much with shoulder pain - reinforced benefit of wearing to prevent further joint dislocation/pain.                    OT Education - 01/17/21 1303     Education Details hand, wrist, forearm AROM whil in immobilizer to prevent swelling, stiffness    Person(s) Educated Patient;Parent(s)    Methods Explanation;Demonstration    Comprehension Verbalized understanding;Returned demonstration  have comorbidities impacting  occupational performance    Modification or Assistance to Complete Evaluation  Min-Moderate modification of tasks or assist with assess necessary to complete eval    OT Frequency 1x / week    OT Duration 12 weeks   may be less d/t RUE precautions and decreasing freq PRN   OT Treatment/Interventions Self-care/ADL training;Aquatic Therapy;Moist Heat;Fluidtherapy;DME and/or AE instruction;Splinting;Therapeutic activities;Ultrasound;Therapeutic exercise;Cognitive remediation/compensation;Visual/perceptual remediation/compensation;Passive range of motion;Functional Mobility Training;Neuromuscular education;Cryotherapy;Electrical Stimulation;Energy conservation;Manual Therapy;Patient/family education    Plan Hold OT until directed by surgeon    Consulted and Agree with Plan of Care Patient;Family member/caregiver             Patient will benefit from skilled therapeutic intervention in order to improve the following deficits and impairments:   Body Structure / Function / Physical Skills: ADL, Decreased knowledge of use of DME, Strength, Pain, GMC, FMC, Coordination, Sensation, Vision, IADL, ROM, UE functional use, Proprioception, Dexterity Cognitive Skills: Memory, Attention, Problem Solve, Safety Awareness, Sequencing, Emotional, Understand     Visit Diagnosis: Muscle weakness (generalized)  Decreased coordination  Abnormal posture  Acute pain of right shoulder  Other lack of coordination  Attention and concentration deficit    Problem List Patient Active Problem List   Diagnosis Date Noted   Cerebral thrombosis with 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 [place], [place], [address] Phone: [phone]   Fax:  [phone]  Name: ZAILAH ZAGAMI MRN: [id number] Date of Birth:  March 06, 1995

## 2021-01-17 NOTE — Therapy (Signed)
[facility] [address] [place], [place], [address] Phone: [phone]   Fax:  [phone]  Physical Therapy Treatment  Patient Details  Name: Morgan Beltran MRN: [id number] Date of Birth: 1995/11/14 Referring Provider (PT): [place], [place]   Encounter Date: 01/17/2021   PT End of Session - 01/17/21 1136     Visit Number 2    Number of Visits 12    Date for PT Re-Evaluation 03/27/21    Authorization Type [employer] MCD- no preapproval needed until after 12th visit combined between all diciplines.    PT Start Time 1018    PT Stop Time 1100    PT Time Calculation (min) 42 min    Activity Tolerance Patient limited by pain;Patient tolerated treatment well;No increased pain    Behavior During Therapy Flat affect             Past Medical History:  Diagnosis Date   Asthma    childhood asthma, no inaher, no prob as adult   Cervicitis 2014   SVD (spontaneous vaginal delivery)    x 1    Past Surgical History:  Procedure Laterality Date   DILATION AND EVACUATION N/A 06/27/2017   Procedure: DILATATION AND EVACUATION;  Surgeon: Tereso Newcomer, MD;  Location: [facility];  Service: Gynecology;  Laterality: N/A;   NO PAST SURGERIES     WISDOM TOOTH EXTRACTION     WISDOM TOOTH EXTRACTION  2012    There were no vitals filed for this visit.   Subjective Assessment - 01/17/21 1021     Subjective Got into [facility] and they are planning surgery for [facility] joint dislocation. Unsure of when.    Patient is accompained by: Family member   mom   Pertinent History Pt is to be NWB in RUE until follow up with ortho MD    How long can you stand comfortably? 30 mins-1 hour    How long can you walk comfortably? 30 mins-1 hour    Patient Stated Goals "Fix my shoulder"    Currently in Pain? Yes    Pain Score 7     Pain Location Shoulder    Pain Orientation Right                    [facility] Adult PT  Treatment/Exercise - 01/17/21 1053       Transfers   Transfers Sit to Stand;Stand to Sit    Sit to Stand 5: Supervision    Stand to Sit 5: Supervision      Ambulation/Gait   Ambulation/Gait Yes    Ambulation/Gait Assistance 5: Supervision;4: Min guard;4: Min assist    Ambulation Distance (Feet) --   around clinic with session   Assistive device None    Gait Pattern Step-through pattern;Decreased arm swing - right;Decreased stride length;Decreased dorsiflexion - right;Decreased dorsiflexion - left;Ataxic;Narrow base of support;Trunk flexed    Ambulation Surface Level;Indoor      Neuro Re-ed    Neuro Re-ed Details  for balance/dual tasking: gait around track naming girls names, then boys names A-Z (unable to complete alphebet for boys names due to time contraints) with min guard assist for safety. cues to names ~30% with girls, ~20% with boys.      Exercises   Exercises Other Exercises    Other Exercises  issued HEP for strengthening and balance with no issues noted or reported in session. Refer to [employer] program for full details. Cues needed on form/technique with min  guard assist for safety. Mom present and eduated on HEP as well.            Issued to HEP this session:  Access Code: [email or web address] URL: [email or web address] Date: 01/17/2021 Prepared by: Sallyanne Kuster  Exercises Sit to Stand with Arms Crossed - 1 x daily - 5 x weekly - 1 sets - 10 reps Standing Single Leg Stance with Unilateral Counter Support - 1 x daily - 5 x weekly - 1 sets - 3 reps - 10 seconds hold Standing Tandem Balance with Unilateral Counter Support - 1 x daily - 5 x weekly - 1 sets - 3 reps - 20 secondes hold Standing Near Stance in Corner with Eyes Closed - 1 x daily - 5 x weekly - 1 sets - 3 reps - 30 seconds hold Standing Balance in Corner with Eyes Closed - 1 x daily - 5 x weekly - 1 sets - 10 reps        PT Education - 01/17/21 1135     Education Details HEP for strenthening and  balance    Person(s) Educated Patient;Parent(s)    Methods Explanation;Demonstration;Verbal cues;Handout    Comprehension Verbalized understanding;Returned demonstration;Verbal cues required;Need further instruction              PT Short Term Goals - 12/27/20 1407       PT SHORT TERM GOAL #1   Title Pt/caregiver will be independent with initial HEP in order to indicate improved function and dec fall risk (Target Date: by 6th visit)    Baseline dependent    Time 6    Period Weeks    Status New      PT SHORT TERM GOAL #2   Title Pt will improve 5TSS to </=11.5 secs without UE support and with improved midline posture and no posterior LOB.    Baseline 14.37 secs    Time 6    Period Weeks    Status New      PT SHORT TERM GOAL #3   Title Pt will improve FGA to >/=25/30 in order to indicate dec fall risk    Baseline 22/30    Time 6    Period Weeks    Status New      PT SHORT TERM GOAL #4   Title Pt will ambulate up/down 4 stairs without rails in reciprocal pattern in order to indicate safe home entry/exit.    Baseline S with use of R rail in step to pattern    Time 6    Period Weeks    Status New      PT SHORT TERM GOAL #5   Title Pt will perform floor <>stand at S level in order to indicate safety when playing with children.    Time 6    Period Weeks    Status New               PT Long Term Goals - 12/27/20 1410       PT LONG TERM GOAL #1   Title Pt/caregiver will be IND with final HEP in order to indicate dec fall risk and improved function. (Target Date: By 12th visit)    Baseline dependent    Time 12    Period Weeks    Status New      PT LONG TERM GOAL #2   Title Pt will improve FGA to >/=28/30 in order to indicate dec fall risk.    Baseline 22/30    Time 12  Period Weeks    Status New      PT LONG TERM GOAL #3   Title Pt will negotiate up/down 4 stairs without rail in reciprocal pattern at mod I level in order to indicate safety when carrying  groceries into home.    Baseline S for stairs with R rail and step to pattern    Time 12    Period Weeks    Status New      PT LONG TERM GOAL #4   Title Pt will perform dual task with no more than 25% cuing and no LOB in order to indicate initiation into community/leisure activity.    Baseline Is not performing at this time    Time 12    Period Weeks    Status New      PT LONG TERM GOAL #5   Title Will assess ability to perform more leisure activities once RUE cleared.    Baseline NWB at this time    Time 12    Period Weeks    Status New                   Plan - 01/17/21 1137     Clinical Impression Statement Today's skilled session focused on establishment of an HEP to address LE strenthening and balance with no issues noted or reported in session. Remainder of session focused on balance with gait/cognitive task with up to min guard assist needed and cues to task at times. No issues noted or reported in session. Pt is awaiting neuro clearance to have AC joint shoulder surgery. Has an appt on12/7/22 with Shanda Bumps at [facility]. The pt is making progress and should benefit from continued PT to progress toward unmet goals.    Personal Factors and Comorbidities Comorbidity 2;Profession;Behavior Pattern    Comorbidities CVA/TBI    Examination-Activity Limitations Bathing;Dressing;Locomotion Level;Lift;Reach Overhead;Stand;Stairs;Squat   caring for children   Examination-Participation Restrictions Community Activity;Driving;Meal Prep;Laundry;Occupation    Stability/Clinical Decision Making Evolving/Moderate complexity    Rehab Potential Good    PT Frequency 2x / week   Did not want to do 2x/wk, therefore did 1x/wk for 12 weeks   PT Duration 6 weeks    PT Treatment/Interventions ADLs/Self Care Home Management;DME Instruction;Gait training;Stair training;Functional mobility training;Therapeutic activities;Therapeutic exercise;Balance training;Neuromuscular re-education;Cognitive  remediation;Patient/family education;Vestibular    PT Next Visit Plan continue to work on LE strengthing, balance training on compliant surfaces, balance with vision removed, dynamic gait and coordination activities.    PT Home Exercise Plan Access Code: [email or web address]    Consulted and Agree with Plan of Care Patient;Family member/caregiver    Family Member Consulted mother             Patient will benefit from skilled therapeutic intervention in order to improve the following deficits and impairments:  Abnormal gait, Decreased balance, Decreased cognition, Decreased coordination, Decreased endurance, Decreased mobility, Decreased safety awareness, Decreased strength, Impaired perceived functional ability, Impaired UE functional use, Impaired sensation, Postural dysfunction  Visit Diagnosis: Muscle weakness (generalized)  Decreased coordination  Other abnormalities of gait and mobility  Abnormal posture     Problem List Patient Active Problem List   Diagnosis Date Noted   Cerebral thrombosis with 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

## 2021-01-18 ENCOUNTER — Other Ambulatory Visit (HOSPITAL_COMMUNITY): Payer: Self-pay

## 2021-01-24 ENCOUNTER — Other Ambulatory Visit: Payer: Self-pay

## 2021-01-24 ENCOUNTER — Encounter: Payer: Self-pay | Admitting: Adult Health

## 2021-01-24 ENCOUNTER — Other Ambulatory Visit (HOSPITAL_COMMUNITY): Payer: Self-pay

## 2021-01-24 ENCOUNTER — Ambulatory Visit (INDEPENDENT_AMBULATORY_CARE_PROVIDER_SITE_OTHER): Payer: Medicaid Other | Admitting: Adult Health

## 2021-01-24 VITALS — BP 99/68 | HR 76 | Ht 62.0 in | Wt 117.0 lb

## 2021-01-24 DIAGNOSIS — I639 Cerebral infarction, unspecified: Secondary | ICD-10-CM | POA: Diagnosis not present

## 2021-01-24 DIAGNOSIS — I69319 Unspecified symptoms and signs involving cognitive functions following cerebral infarction: Secondary | ICD-10-CM
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
## 2021-01-24 NOTE — Patient Instructions (Addendum)
We will repeat an MRI of your brain - you will be called to schedule  We will follow up with you in regards to pursuing surgery and time frame  Continue working with therapies for hopeful ongoing recovery  Continue aspirin 81 mg daily for secondary stroke prevention  Follow up with your primary care doctor regarding low blood pressure and hair loss concerns (may need evaluation by dermatology and cardiology if appropriate)   Schedule follow up visit with your eye doctor if your vision issues persist over the next few weeks     Followup in the future with me Dr. Pearlean Brownie in 3-4 months or call earlier if needed       Thank you for coming to see [place] at [facility]. I hope we have been able to provide you high quality care today.  You may receive a patient satisfaction survey over the next few weeks. We would appreciate your feedback and comments so that we may continue to improve ourselves and the health of our patients.

## 2021-01-25 ENCOUNTER — Ambulatory Visit: Payer: Medicaid Other | Admitting: Physical Therapy

## 2021-01-25 ENCOUNTER — Encounter: Payer: Medicaid Other | Admitting: Occupational Therapy

## 2021-01-25 ENCOUNTER — Ambulatory Visit: Payer: Medicaid Other

## 2021-01-26 NOTE — Progress Notes (Signed)
I agree with the above plan 

## 2021-01-29 NOTE — Progress Notes (Signed)
Chart reviewed with Dr Salvadore Farber, upcoming shoulder surgery will need to be moved to the main OR. Debbie at Dr Serena Croissant office aware.

## 2021-01-31 ENCOUNTER — Other Ambulatory Visit: Payer: Self-pay

## 2021-01-31 ENCOUNTER — Ambulatory Visit: Payer: Medicaid Other

## 2021-01-31 ENCOUNTER — Ambulatory Visit (INDEPENDENT_AMBULATORY_CARE_PROVIDER_SITE_OTHER): Payer: Medicaid Other | Admitting: Cardiology

## 2021-01-31 ENCOUNTER — Ambulatory Visit (INDEPENDENT_AMBULATORY_CARE_PROVIDER_SITE_OTHER): Payer: Medicaid Other

## 2021-01-31 ENCOUNTER — Encounter: Payer: Self-pay | Admitting: Cardiology

## 2021-01-31 VITALS — BP 96/70 | HR 98 | Ht 60.0 in | Wt 120.0 lb

## 2021-01-31 DIAGNOSIS — Z79899 Other long term (current) drug therapy: Secondary | ICD-10-CM

## 2021-01-31 DIAGNOSIS — Z8673 Personal history of transient ischemic attack (TIA), and cerebral infarction without residual deficits: Secondary | ICD-10-CM

## 2021-01-31 DIAGNOSIS — R55 Syncope and collapse: Secondary | ICD-10-CM

## 2021-01-31 NOTE — Progress Notes (Unsigned)
Applied in office
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
## 2021-01-31 NOTE — Patient Instructions (Signed)
Medication Instructions:  Your physician recommends that you continue on your current medications as directed. Please refer to the Current Medication list given to you today.  *If you need a refill on your cardiac medications before your next appointment, please call your pharmacy*   Lab Work: Your physician recommends that you return for lab work in:  TODAY: Antiphospolpid syndrome eval, BMET, Mag   If you have labs (blood work) drawn today and your tests are completely normal, you will receive your results only by: MyChart Message (if you have MyChart) OR A paper copy in the mail If you have any lab test that is abnormal or we need to change your treatment, we will call you to review the results.   Testing/Procedures: Christena Deem- Long Term Monitor Instructions  Your physician has requested you wear a ZIO patch monitor for 14 days.  This is a single patch monitor. [employer] supplies one patch monitor per enrollment. Additional stickers are not available. Please do not apply patch if you will be having a Nuclear Stress Test,  Echocardiogram, Cardiac CT, MRI, or Chest Xray during the period you would be wearing the  monitor. The patch cannot be worn during these tests. You cannot remove and re-apply the  ZIO XT patch monitor.  Your ZIO patch monitor will be mailed 3 day USPS to your address on file. It may take 3-5 days  to receive your monitor after you have been enrolled.  Once you have received your monitor, please review the enclosed instructions. Your monitor  has already been registered assigning a specific monitor serial # to you.  Billing and Patient Assistance Program Information  We have supplied [employer] with any of your insurance information on file for billing purposes. [employer] offers a sliding scale Patient Assistance Program for patients that do not have  insurance, or whose insurance does not completely cover the cost of the ZIO monitor.  You must apply for the Patient  Assistance Program to qualify for this discounted rate.  To apply, please call [employer] at [phone], select option 4, select option 2, ask to apply for  Patient Assistance Program. Meredeth Ide will ask your household income, and how many people  are in your household. They will quote your out-of-pocket cost based on that information.  [employer] will also be able to set up a 77-month, interest-free payment plan if needed.  Applying the monitor   Shave hair from upper left chest.  Hold abrader disc by orange tab. Rub abrader in 40 strokes over the upper left chest as  indicated in your monitor instructions.  Clean area with 4 enclosed alcohol pads. Let dry.  Apply patch as indicated in monitor instructions. Patch will be placed under collarbone on left  side of chest with arrow pointing upward.  Rub patch adhesive wings for 2 minutes. Remove white label marked "1". Remove the white  label marked "2". Rub patch adhesive wings for 2 additional minutes.  While looking in a mirror, press and release button in center of patch. A small green light will  flash 3-4 times. This will be your only indicator that the monitor has been turned on.  Do not shower for the first 24 hours. You may shower after the first 24 hours.  Press the button if you feel a symptom. You will hear a small click. Record Date, Time and  Symptom in the Patient Logbook.  When you are ready to remove the patch, follow instructions on the last 2 pages of  Patient  Logbook. Stick patch monitor onto the last page of Patient Logbook.  Place Patient Logbook in the blue and white box. Use locking tab on box and tape box closed  securely. The blue and white box has prepaid postage on it. Please place it in the mailbox as  soon as possible. Your physician should have your test results approximately 7 days after the  monitor has been mailed back to [facility].  Call [facility] Customer Care at [phone] if you have questions  regarding  your ZIO XT patch monitor. Call them immediately if you see an orange light blinking on your  monitor.  If your monitor falls off in less than 4 days, contact our Monitor department at [phone].  If your monitor becomes loose or falls off after 4 days call [employer] at [phone] for  suggestions on securing your monitor  Your physician has requested that you have a TEE. During a TEE, sound waves are used to create images of your heart. It provides your doctor with information about the size and shape of your heart and how well your hearts chambers and valves are working. In this test, a transducer is attached to the end of a flexible tube that is guided down you throat and into your esophagus (the tube leading from your mouth to your stomach) to get a more detailed image of your heart. Once the TEE has determined that a blood clot is not present, the cardioversion begins. You usually go home the day of the procedure. Please see the instruction sheet given to you today for more information.  You are scheduled for a TEE on Dec 19th with Dr. Servando Salina.  Please arrive at the [facility] (Main Entrance A) at [facility]: [address] [place], [place] [address] at 12 am/pm. (1 hour prior to procedure)  DIET: Nothing to eat or drink after midnight except a sip of water with medications (see medication instructions below)  FYI: For your safety, and to allow [place] to monitor your vital signs accurately during the surgery/procedure we request that   if you have artificial nails, gel coating, SNS etc. Please have those removed prior to your surgery/procedure. Not having the nail coverings /polish removed may result in cancellation or delay of your surgery/procedure.  You must have a responsible person to drive you home and stay in the waiting area during your procedure. Failure to do so could result in cancellation.  Bring your insurance cards.  *Special Note: Every effort is made to  have your procedure done on time. Occasionally there are emergencies that occur at the hospital that may cause delays. Please be patient if a delay does occur.    Follow-Up: At [facility], you and your health needs are our priority.  As part of our continuing mission to provide you with exceptional heart care, we have created designated Provider Care Teams.  These Care Teams include your primary Cardiologist (physician) and Advanced Practice Providers (APPs -  Physician Assistants and Nurse Practitioners) who all work together to provide you with the care you need, when you need it.  We recommend signing up for the patient portal called "MyChart".  Sign up information is provided on this After Visit Summary.  MyChart is used to connect with patients for Virtual Visits (Telemedicine).  Patients are able to view lab/test results, encounter notes, upcoming appointments, etc.  Non-urgent messages can be sent to your provider as well.   To learn more about what you  can do with MyChart, go to [email or web address].    Your next appointment:   4 month(s)  The format for your next appointment:   In Person  Provider:   Thomasene Ripple, DO     Other Instructions

## 2021-02-01 ENCOUNTER — Ambulatory Visit: Payer: Medicaid Other

## 2021-02-01 ENCOUNTER — Encounter: Payer: Medicaid Other | Admitting: Occupational Therapy

## 2021-02-01 ENCOUNTER — Ambulatory Visit: Payer: Medicaid Other | Attending: Physician Assistant | Admitting: Rehabilitation

## 2021-02-02 LAB — ANTIPHOSPHOLIPID SYNDROME EVAL, BLD
APTT PPP: 23.9 s (ref 22.9–30.2)
Anticardiolipin IgG: <9 GPL U/mL (ref 0–14)
Anticardiolipin IgM: <9 MPL U/mL (ref 0–12)
Beta-2 Glyco 1 IgM: <9 GPI IgM units (ref 0–32)
Beta-2 Glyco I IgG: <9 GPI IgG units (ref 0–20)
Dilute Viper Venom Time: 30.3 s (ref 0.0–47.0)
Hexagonal Phase Phospholipid: 3 s (ref 0–11)
INR: 1 (ref 0.9–1.2)
PT: 10.3 s (ref 9.1–12.0)
Thrombin Time: 17.2 s (ref 0.0–23.0)

## 2021-02-02 LAB — BASIC METABOLIC PANEL
BUN/Creatinine Ratio: 17 (ref 9–23)
BUN: 14 mg/dL (ref 6–20)
CO2: 18 mmol/L — ABNORMAL LOW (ref 20–29)
Calcium: 10.1 mg/dL (ref 8.7–10.2)
Chloride: 102 mmol/L (ref 96–106)
Creatinine, Ser: 0.83 mg/dL (ref 0.57–1.00)
Glucose: 71 mg/dL (ref 70–99)
Potassium: 3.6 mmol/L (ref 3.5–5.2)
Sodium: 144 mmol/L (ref 134–144)
eGFR: 100 mL/min/{1.73_m2} (ref >59)

## 2021-02-02 LAB — COAG STUDIES INTERP REPORT

## 2021-02-02 LAB — MAGNESIUM: Magnesium: 1.9 mg/dL (ref 1.6–2.3)

## 2021-02-02 NOTE — Addendum Note (Signed)
Addended by: Thomasene Ripple on: 02/02/2021 02:34 PM   Modules accepted: Orders

## 2021-02-05 ENCOUNTER — Other Ambulatory Visit: Payer: Self-pay

## 2021-02-05 ENCOUNTER — Ambulatory Visit (HOSPITAL_COMMUNITY): Payer: Medicaid Other | Admitting: Anesthesiology

## 2021-02-05 ENCOUNTER — Ambulatory Visit (HOSPITAL_COMMUNITY)
Admission: RE | Admit: 2021-02-05 | Discharge: 2021-02-05 | Disposition: A | Discharge status: Home / Self Care | Payer: Medicaid Other | Attending: Cardiology | Admitting: Cardiology

## 2021-02-05 ENCOUNTER — Ambulatory Visit (HOSPITAL_BASED_OUTPATIENT_CLINIC_OR_DEPARTMENT_OTHER)
Admission: RE | Admit: 2021-02-05 | Discharge: 2021-02-05 | Disposition: A | Discharge status: Home / Self Care | Payer: Medicaid Other | Source: Ambulatory Visit | Attending: Cardiology | Admitting: Cardiology

## 2021-02-05 ENCOUNTER — Encounter (HOSPITAL_COMMUNITY): Payer: Self-pay | Admitting: Cardiology

## 2021-02-05 ENCOUNTER — Encounter (HOSPITAL_COMMUNITY)
Admission: RE | Disposition: A | Discharge status: Home / Self Care | Payer: Self-pay | Source: Home / Self Care | Attending: Cardiology

## 2021-02-05 DIAGNOSIS — Q2112 Patent foramen ovale: Secondary | ICD-10-CM

## 2021-02-05 DIAGNOSIS — Q231 Congenital insufficiency of aortic valve: Secondary | ICD-10-CM | POA: Insufficient documentation

## 2021-02-05 DIAGNOSIS — I639 Cerebral infarction, unspecified: Secondary | ICD-10-CM | POA: Diagnosis not present

## 2021-02-05 DIAGNOSIS — R55 Syncope and collapse: Secondary | ICD-10-CM | POA: Diagnosis present

## 2021-02-05 DIAGNOSIS — Z8673 Personal history of transient ischemic attack (TIA), and cerebral infarction without residual deficits: Secondary | ICD-10-CM | POA: Diagnosis not present

## 2021-02-05 HISTORY — DX: Patent foramen ovale: Q21.12

## 2021-02-05 HISTORY — PX: BUBBLE STUDY: SHX6837

## 2021-02-05 HISTORY — PX: TEE WITHOUT CARDIOVERSION: SHX5443

## 2021-02-05 SURGERY — ECHOCARDIOGRAM, TRANSESOPHAGEAL
Anesthesia: Monitor Anesthesia Care

## 2021-02-05 MED ORDER — DEXMEDETOMIDINE (PRECEDEX) IN NS 20 MCG/5ML (4 MCG/ML) IV SYRINGE
PREFILLED_SYRINGE | INTRAVENOUS | Status: DC | PRN
  Administered 2021-02-05: 8 ug via INTRAVENOUS
  Administered 2021-02-05: 4 ug via INTRAVENOUS
  Administered 2021-02-05: 8 ug via INTRAVENOUS

## 2021-02-05 MED ORDER — PROPOFOL 10 MG/ML IV BOLUS
INTRAVENOUS | Status: DC | PRN
  Administered 2021-02-05: 20 mg via INTRAVENOUS
  Administered 2021-02-05: 30 mg via INTRAVENOUS
  Administered 2021-02-05: 20 mg via INTRAVENOUS
  Administered 2021-02-05 (×2): 10 mg via INTRAVENOUS
  Administered 2021-02-05: 20 mg via INTRAVENOUS

## 2021-02-05 MED ORDER — PROPOFOL 500 MG/50ML IV EMUL
INTRAVENOUS | Status: DC | PRN
  Administered 2021-02-05: 150 ug/kg/min via INTRAVENOUS

## 2021-02-05 MED ORDER — SODIUM CHLORIDE 0.9 % IV SOLN: INTRAVENOUS | Status: DC

## 2021-02-05 MED ORDER — LIDOCAINE 2% (20 MG/ML) 5 ML SYRINGE
INTRAMUSCULAR | Status: DC | PRN
  Administered 2021-02-05: 60 mg via INTRAVENOUS

## 2021-02-05 NOTE — CV Procedure (Signed)
TRANSESOPHAGEAL ECHOCARDIOGRAM   NAME:  Morgan Beltran    MRN: [id number] DOB:  [birth year]    ADMIT DATE: 02/05/2021  INDICATIONS: History of CVA  PROCEDURE:   Informed consent was obtained prior to the procedure. The risks, benefits and alternatives for the procedure were discussed and the patient comprehended these risks.  Risks include, but are not limited to, cough, sore throat, vomiting, nausea, somnolence, esophageal and stomach trauma or perforation, bleeding, low blood pressure, aspiration, pneumonia, infection, trauma to the teeth and death.    Procedural time out performed. The oropharynx was anesthetized with viscous lidocaine.  Anesthesia was administered by the anaesthesilogy team.  The patient was administered a total of Propofol 438 mg, , Precedex 20 mcg to achieve and maintain moderate to deep conscious sedation.  The patient's heart rate, blood pressure, and oxygen saturation were monitored continuously during the procedure.  The transesophageal probe was inserted in the esophagus and stomach without difficulty and multiple views were obtained.   The patient tolerated the procedure well.  COMPLICATIONS:    There were no immediate complications.  KEY FINDINGS:  LVEF 55-60%. Small PFO - + agitated saline study, bicuspid aortic valve Full report to follow. Further management per primary team.   Thomasene Ripple, DO [facility]   [facility]  12:55 PM

## 2021-02-05 NOTE — Anesthesia Preprocedure Evaluation (Signed)
Anesthesia Evaluation  Patient identified by MRN, date of birth, ID band Patient awake    Reviewed: Allergy & Precautions, H&P , NPO status , Patient's Chart, lab work & pertinent test results  Airway Mallampati: II   Neck ROM: full    Dental   Pulmonary asthma , Patient abstained from smoking.,    breath sounds clear to auscultation       Cardiovascular negative cardio ROS   Rhythm:regular Rate:Normal     Neuro/Psych CVA    GI/Hepatic   Endo/Other    Renal/GU      Musculoskeletal   Abdominal   Peds  Hematology   Anesthesia Other Findings   Reproductive/Obstetrics                             Anesthesia Physical Anesthesia Plan  ASA: 2  Anesthesia Plan: MAC   Post-op Pain Management:    Induction: Intravenous  PONV Risk Score and Plan: 2 and Propofol infusion and Treatment may vary due to age or medical condition  Airway Management Planned: Nasal Cannula  Additional Equipment:   Intra-op Plan:   Post-operative Plan:   Informed Consent: I have reviewed the patients History and Physical, chart, labs and discussed the procedure including the risks, benefits and alternatives for the proposed anesthesia with the patient or authorized representative who has indicated his/her understanding and acceptance.     Dental advisory given  Plan Discussed with: CRNA, Anesthesiologist and Surgeon  Anesthesia Plan Comments:         Anesthesia Quick Evaluation

## 2021-02-05 NOTE — Progress Notes (Signed)
°  Echocardiogram 2D Echocardiogram has been performed.  [name] 02/05/2021, 1:09 PM

## 2021-02-05 NOTE — Anesthesia Procedure Notes (Addendum)
Procedure Name: MAC Date/Time: 02/05/2021 12:17 PM Performed by: Dorthea Cove, CRNA Pre-anesthesia Checklist: Patient identified, Emergency Drugs available, Suction available, Patient being monitored and Timeout performed Patient Re-evaluated:Patient Re-evaluated prior to induction Oxygen Delivery Method: Nasal cannula Preoxygenation: Pre-oxygenation with 100% oxygen Induction Type: IV induction Airway Equipment and Method: Bite block Placement Confirmation: positive ETCO2 and CO2 detector Dental Injury: Teeth and Oropharynx as per pre-operative assessment

## 2021-02-05 NOTE — Transfer of Care (Signed)
Immediate Anesthesia Transfer of Care Note  Patient: Morgan Beltran  Procedure(s) Performed: TRANSESOPHAGEAL ECHOCARDIOGRAM (TEE) BUBBLE STUDY  Patient Location: Endoscopy Unit  Anesthesia Type:MAC  Level of Consciousness: drowsy, patient cooperative and responds to stimulation  Airway & Oxygen Therapy: Patient Spontanous Breathing  Post-op Assessment: Report given to RN, Post -op Vital signs reviewed and stable and Patient moving all extremities X 4  Post vital signs: Reviewed and stable  Last Vitals:  Vitals Value Taken Time  BP 98/56 02/05/21 1255  Temp    Pulse 71 02/05/21 1256  Resp 22 02/05/21 1256  SpO2 97 % 02/05/21 1256  Vitals shown include unvalidated device data.  Last Pain:  Vitals:   02/05/21 1123  TempSrc: Temporal  PainSc: 0-No pain         Complications: No notable events documented.

## 2021-02-05 NOTE — Discharge Instructions (Signed)

## 2021-02-05 NOTE — Anesthesia Procedure Notes (Deleted)
Procedure Name: MAC Date/Time: 02/05/2021 11:25 AM Performed by: Rande Brunt, CRNA Pre-anesthesia Checklist: Emergency Drugs available, Suction available, Patient being monitored and Patient identified Patient Re-evaluated:Patient Re-evaluated prior to induction Oxygen Delivery Method: Nasal cannula Preoxygenation: Pre-oxygenation with 100% oxygen Induction Type: IV induction Airway Equipment and Method: Bite block Placement Confirmation: CO2 detector and positive ETCO2 Dental Injury: Teeth and Oropharynx as per pre-operative assessment

## 2021-02-05 NOTE — Interval H&P Note (Signed)
History and Physical Interval Note:  02/05/2021 12:10 PM  Morgan Beltran  has presented today for surgery, with the diagnosis of STROKE.  The various methods of treatment have been discussed with the patient and family. After consideration of risks, benefits and other options for treatment, the patient has consented to  Procedure(s): TRANSESOPHAGEAL ECHOCARDIOGRAM (TEE) (N/A) as a surgical intervention.  The patient's history has been reviewed, patient examined, no change in status, stable for surgery.  I have reviewed the patient's chart and labs.  Questions were answered to the patient's satisfaction.     Harly Pipkins

## 2021-02-07 ENCOUNTER — Encounter (HOSPITAL_COMMUNITY): Payer: Self-pay | Admitting: Cardiology

## 2021-02-07 ENCOUNTER — Ambulatory Visit: Payer: Medicaid Other

## 2021-02-07 ENCOUNTER — Encounter: Payer: Medicaid Other | Admitting: Occupational Therapy

## 2021-02-07 NOTE — Anesthesia Postprocedure Evaluation (Signed)
Anesthesia Post Note  Patient: Morgan Beltran  Procedure(s) Performed: TRANSESOPHAGEAL ECHOCARDIOGRAM (TEE) BUBBLE STUDY     Patient location during evaluation: Endoscopy Anesthesia Type: MAC Level of consciousness: awake and alert Pain management: pain level controlled Vital Signs Assessment: post-procedure vital signs reviewed and stable Respiratory status: spontaneous breathing, nonlabored ventilation, respiratory function stable and patient connected to nasal cannula oxygen Cardiovascular status: stable and blood pressure returned to baseline Postop Assessment: no apparent nausea or vomiting Anesthetic complications: no   No notable events documented.  Last Vitals:  Vitals:   02/05/21 1304 02/05/21 1315  BP: 97/77 (!) 92/51  Pulse: 66 63  Resp: 18 18  Temp:    SpO2: 100% 100%    Last Pain:  Vitals:   02/05/21 1315  TempSrc:   PainSc: 0-No pain   Pain Goal:                   Kylle Lall S

## 2021-02-08 ENCOUNTER — Other Ambulatory Visit (HOSPITAL_BASED_OUTPATIENT_CLINIC_OR_DEPARTMENT_OTHER): Payer: Self-pay
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
## 2021-02-13 ENCOUNTER — Encounter (HOSPITAL_COMMUNITY): Payer: Self-pay | Admitting: Orthopaedic Surgery
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
## 2021-02-13 NOTE — Progress Notes (Addendum)
Pt is scheduled for surgery tom, 02/14/21 and will arrive to [facility] at 0945. She stopped taking ASA 81 mg 2 days ago. Pre-op phone call list completed.
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
## 2021-02-14 ENCOUNTER — Other Ambulatory Visit: Payer: Self-pay

## 2021-02-14 ENCOUNTER — Ambulatory Visit (HOSPITAL_COMMUNITY): Payer: Medicaid Other | Admitting: Anesthesiology

## 2021-02-14 ENCOUNTER — Ambulatory Visit (HOSPITAL_COMMUNITY): Payer: Medicaid Other

## 2021-02-14 ENCOUNTER — Encounter (HOSPITAL_COMMUNITY): Payer: Self-pay | Admitting: Orthopaedic Surgery

## 2021-02-14 ENCOUNTER — Ambulatory Visit (HOSPITAL_COMMUNITY)
Admission: RE | Admit: 2021-02-14 | Discharge: 2021-02-14 | Disposition: A | Discharge status: Home / Self Care | Payer: Medicaid Other | Source: Ambulatory Visit | Attending: Orthopaedic Surgery | Admitting: Orthopaedic Surgery

## 2021-02-14 ENCOUNTER — Encounter (HOSPITAL_COMMUNITY)
Admission: RE | Disposition: A | Discharge status: Home / Self Care | Payer: Self-pay | Source: Ambulatory Visit | Attending: Orthopaedic Surgery

## 2021-02-14 DIAGNOSIS — S43101A Unspecified dislocation of right acromioclavicular joint, initial encounter: Secondary | ICD-10-CM | POA: Diagnosis not present

## 2021-02-14 DIAGNOSIS — Z87820 Personal history of traumatic brain injury: Secondary | ICD-10-CM | POA: Diagnosis not present

## 2021-02-14 DIAGNOSIS — M24411 Recurrent dislocation, right shoulder: Secondary | ICD-10-CM | POA: Insufficient documentation

## 2021-02-14 DIAGNOSIS — Z419 Encounter for procedure for purposes other than remedying health state, unspecified: Secondary | ICD-10-CM

## 2021-02-14 HISTORY — DX: Cerebral infarction, unspecified: I63.9

## 2021-02-14 HISTORY — PX: SHOULDER ARTHROSCOPY WITH CAPSULORRHAPHY: SHX6454

## 2021-02-14 LAB — CBC
HCT: 38.2 % (ref 36.0–46.0)
Hemoglobin: 12.6 g/dL (ref 12.0–15.0)
MCH: 30.5 pg (ref 26.0–34.0)
MCHC: 33 g/dL (ref 30.0–36.0)
MCV: 92.5 fL (ref 80.0–100.0)
Platelets: 268 10*3/uL (ref 150–400)
RBC: 4.13 MIL/uL (ref 3.87–5.11)
RDW: 12.3 % (ref 11.5–15.5)
WBC: 6.8 10*3/uL (ref 4.0–10.5)
nRBC: 0 % (ref 0.0–0.2)

## 2021-02-14 LAB — POCT PREGNANCY, URINE: Preg Test, Ur: NEGATIVE

## 2021-02-14 IMAGING — RF DG SHOULDER 1V*R*
1 series · 2 of 2 positions shown · non-contrast
Comparison: [DATE]

CLINICAL DATA: AC joint reconstruction

EXAM:
RIGHT SHOULDER - 1 VIEW

[Series 1: run · 2 of 2 slices shown]
[im 1/2]
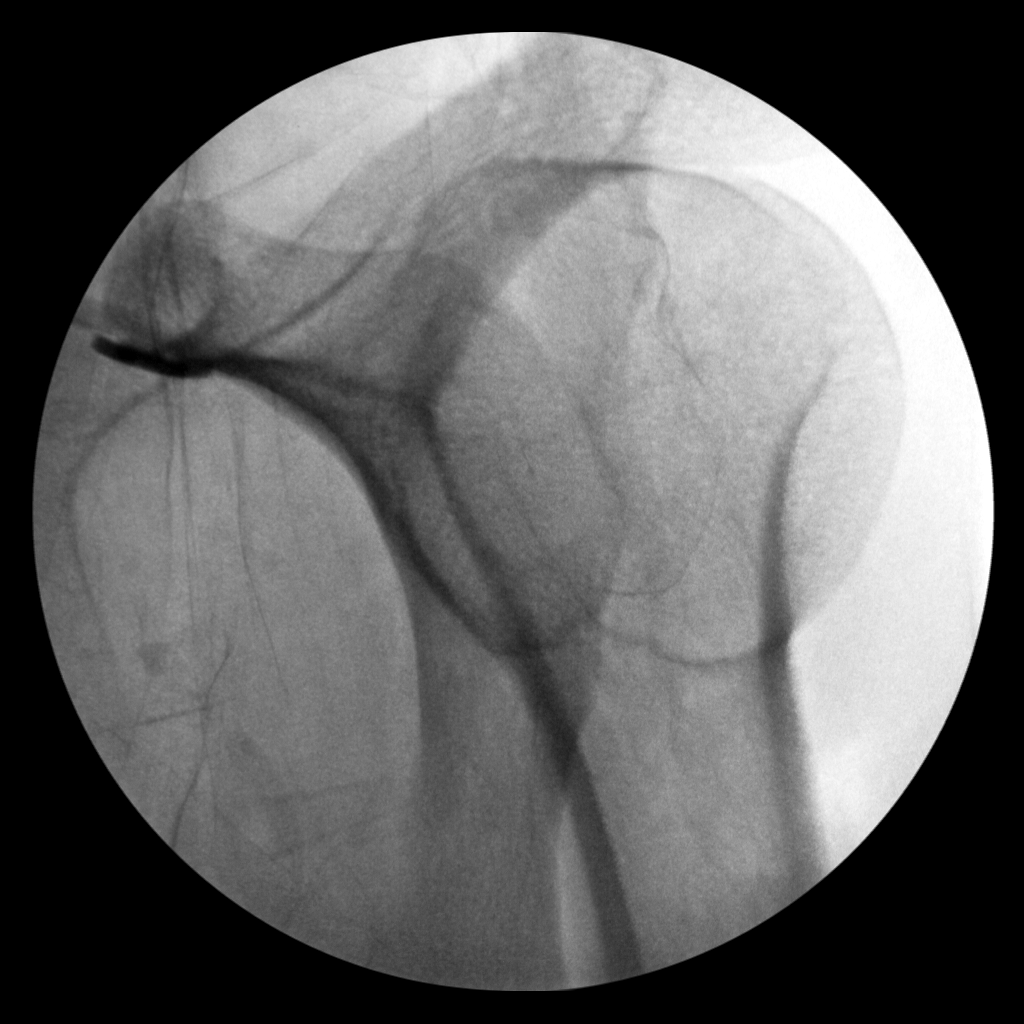
[im 2/2]
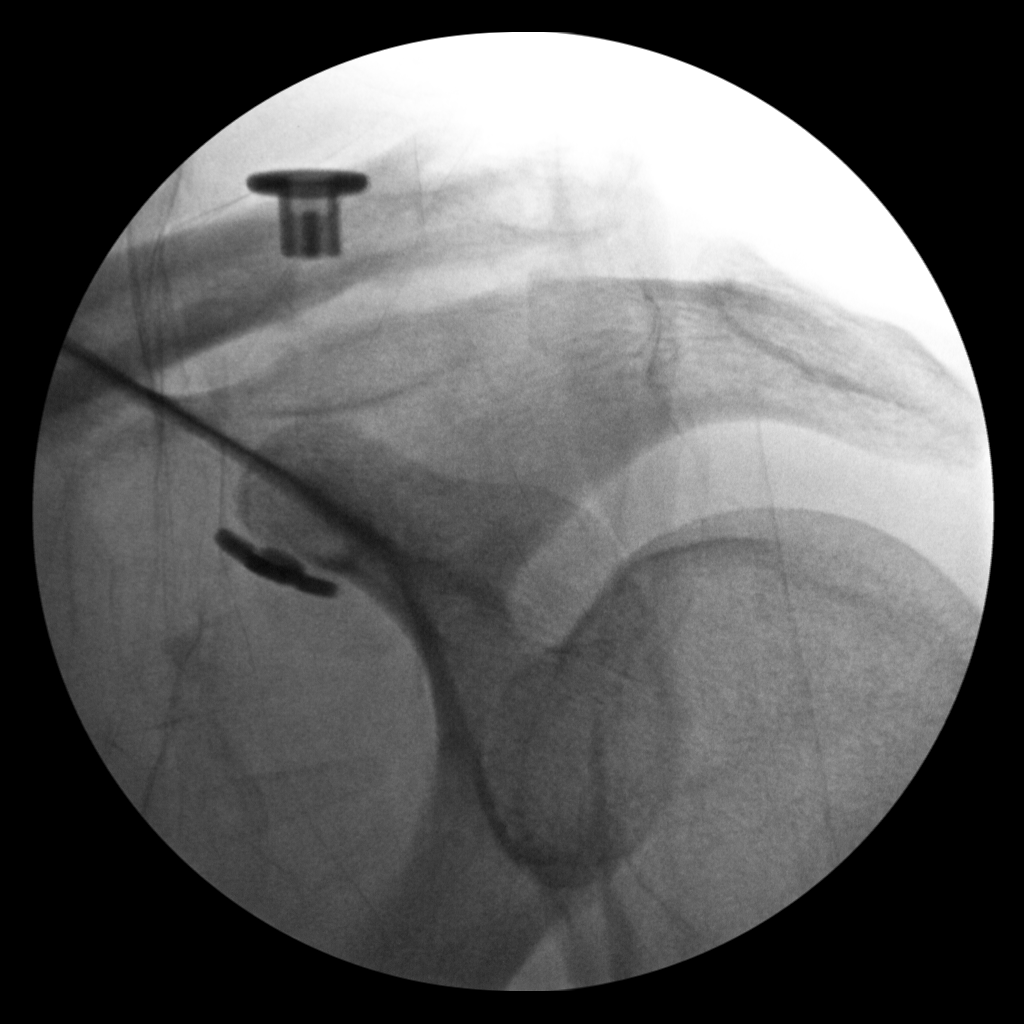

[2 of 2 positions shown; findings below may reference images not displayed]

FINDINGS: Two low resolution intraoperative spot views of the right shoulder.
Total fluoroscopy time was 1 minutes 5 seconds. The images
demonstrate surgical hardware at the clavicle and coracoid process
of the scapula.
IMPRESSION: Intraoperative fluoroscopic assistance provided during right
shoulder surgery

## 2021-02-14 SURGERY — SHOULDER ATHROSCOPY WITH CAPSULORRHAPHY
Anesthesia: Regional | Site: Shoulder | Laterality: Right

## 2021-02-14 MED ORDER — CHLORHEXIDINE GLUCONATE 0.12 % MT SOLN
OROMUCOSAL | Status: AC
  Administered 2021-02-14: 10:00:00 15 mL via OROMUCOSAL
  Filled 2021-02-14: qty 15

## 2021-02-14 MED ORDER — TRANEXAMIC ACID-NACL 1000-0.7 MG/100ML-% IV SOLN
INTRAVENOUS | Status: AC
  Filled 2021-02-14: qty 100

## 2021-02-14 MED ORDER — DIPHENHYDRAMINE HCL 50 MG/ML IJ SOLN
INTRAMUSCULAR | Status: AC
  Filled 2021-02-14: qty 1

## 2021-02-14 MED ORDER — ROCURONIUM BROMIDE 10 MG/ML (PF) SYRINGE
PREFILLED_SYRINGE | INTRAVENOUS | Status: AC
  Filled 2021-02-14: qty 10

## 2021-02-14 MED ORDER — FENTANYL CITRATE (PF) 250 MCG/5ML IJ SOLN
INTRAMUSCULAR | Status: AC
  Filled 2021-02-14: qty 5

## 2021-02-14 MED ORDER — FENTANYL CITRATE (PF) 100 MCG/2ML IJ SOLN
INTRAMUSCULAR | Status: AC
  Administered 2021-02-14: 12:00:00 100 ug via INTRAVENOUS
  Filled 2021-02-14: qty 2

## 2021-02-14 MED ORDER — CEFAZOLIN SODIUM-DEXTROSE 2-4 GM/100ML-% IV SOLN
2.0000 g | INTRAVENOUS | Status: AC
  Administered 2021-02-14: 13:00:00 2 g via INTRAVENOUS
  Filled 2021-02-14: qty 100

## 2021-02-14 MED ORDER — ROCURONIUM BROMIDE 10 MG/ML (PF) SYRINGE
PREFILLED_SYRINGE | INTRAVENOUS | Status: DC | PRN
  Administered 2021-02-14: 100 mg via INTRAVENOUS

## 2021-02-14 MED ORDER — ACETAMINOPHEN 500 MG PO TABS
ORAL_TABLET | ORAL | Status: AC
  Administered 2021-02-14: 10:00:00 1000 mg via ORAL
  Filled 2021-02-14: qty 2

## 2021-02-14 MED ORDER — EPINEPHRINE PF 1 MG/ML IJ SOLN
INTRAMUSCULAR | Status: DC | PRN
  Administered 2021-02-14 (×2): 1 mg

## 2021-02-14 MED ORDER — DEXMEDETOMIDINE (PRECEDEX) IN NS 20 MCG/5ML (4 MCG/ML) IV SYRINGE
PREFILLED_SYRINGE | INTRAVENOUS | Status: AC
  Filled 2021-02-14: qty 5

## 2021-02-14 MED ORDER — 0.9 % SODIUM CHLORIDE (POUR BTL) OPTIME
TOPICAL | Status: DC | PRN
  Administered 2021-02-14: 13:00:00 1000 mL

## 2021-02-14 MED ORDER — PROPOFOL 10 MG/ML IV BOLUS
INTRAVENOUS | Status: DC | PRN
  Administered 2021-02-14: 150 mg via INTRAVENOUS

## 2021-02-14 MED ORDER — DEXAMETHASONE SODIUM PHOSPHATE 10 MG/ML IJ SOLN
INTRAMUSCULAR | Status: DC | PRN
  Administered 2021-02-14: 5 mg via INTRAVENOUS

## 2021-02-14 MED ORDER — LACTATED RINGERS IV SOLN: INTRAVENOUS | Status: DC

## 2021-02-14 MED ORDER — BUPIVACAINE HCL (PF) 0.5 % IJ SOLN
INTRAMUSCULAR | Status: DC | PRN
  Administered 2021-02-14: 25 mL via PERINEURAL

## 2021-02-14 MED ORDER — EPINEPHRINE PF 1 MG/ML IJ SOLN
INTRAMUSCULAR | Status: AC
  Filled 2021-02-14: qty 2

## 2021-02-14 MED ORDER — GABAPENTIN 300 MG PO CAPS
ORAL_CAPSULE | ORAL | Status: AC
  Administered 2021-02-14: 10:00:00 300 mg via ORAL
  Filled 2021-02-14: qty 1

## 2021-02-14 MED ORDER — FENTANYL CITRATE (PF) 250 MCG/5ML IJ SOLN
INTRAMUSCULAR | Status: DC | PRN
  Administered 2021-02-14: 50 ug via INTRAVENOUS

## 2021-02-14 MED ORDER — MIDAZOLAM HCL 2 MG/2ML IJ SOLN
INTRAMUSCULAR | Status: DC | PRN
  Administered 2021-02-14: 2 mg via INTRAVENOUS

## 2021-02-14 MED ORDER — TRANEXAMIC ACID-NACL 1000-0.7 MG/100ML-% IV SOLN
1000.0000 mg | INTRAVENOUS | Status: AC
  Administered 2021-02-14: 13:00:00 1000 mg via INTRAVENOUS

## 2021-02-14 MED ORDER — KETOROLAC TROMETHAMINE 30 MG/ML IJ SOLN
INTRAMUSCULAR | Status: DC | PRN
  Administered 2021-02-14: 30 mg via INTRAVENOUS

## 2021-02-14 MED ORDER — OXYCODONE HCL 5 MG/5ML PO SOLN: 5.0000 mg | Freq: Once | ORAL | Status: DC | PRN

## 2021-02-14 MED ORDER — BUPIVACAINE HCL (PF) 0.25 % IJ SOLN
INTRAMUSCULAR | Status: AC
  Filled 2021-02-14: qty 30

## 2021-02-14 MED ORDER — FENTANYL CITRATE (PF) 100 MCG/2ML IJ SOLN: 100.0000 ug | Freq: Once | INTRAMUSCULAR | Status: AC

## 2021-02-14 MED ORDER — MIDAZOLAM HCL 2 MG/2ML IJ SOLN
INTRAMUSCULAR | Status: AC
  Administered 2021-02-14: 12:00:00 2 mg via INTRAVENOUS
  Filled 2021-02-14: qty 2

## 2021-02-14 MED ORDER — PROMETHAZINE HCL 25 MG/ML IJ SOLN: 6.2500 mg | INTRAMUSCULAR | Status: DC | PRN

## 2021-02-14 MED ORDER — ORAL CARE MOUTH RINSE: 15.0000 mL | Freq: Once | OROMUCOSAL | Status: AC

## 2021-02-14 MED ORDER — ONDANSETRON HCL 4 MG/2ML IJ SOLN
INTRAMUSCULAR | Status: AC
  Filled 2021-02-14: qty 2

## 2021-02-14 MED ORDER — BUPIVACAINE LIPOSOME 1.3 % IJ SUSP
INTRAMUSCULAR | Status: DC | PRN
  Administered 2021-02-14: 10 mL via PERINEURAL

## 2021-02-14 MED ORDER — SODIUM CHLORIDE 0.9 % IR SOLN
Status: DC | PRN
  Administered 2021-02-14 (×6): 3000 mL

## 2021-02-14 MED ORDER — MIDAZOLAM HCL 2 MG/2ML IJ SOLN
INTRAMUSCULAR | Status: AC
  Filled 2021-02-14: qty 2

## 2021-02-14 MED ORDER — MIDAZOLAM HCL 2 MG/2ML IJ SOLN: 2.0000 mg | Freq: Once | INTRAMUSCULAR | Status: AC

## 2021-02-14 MED ORDER — KETOROLAC TROMETHAMINE 30 MG/ML IJ SOLN: 30.0000 mg | Freq: Once | INTRAMUSCULAR | Status: DC | PRN

## 2021-02-14 MED ORDER — VANCOMYCIN HCL 500 MG IV SOLR
INTRAVENOUS | Status: AC
  Filled 2021-02-14: qty 10

## 2021-02-14 MED ORDER — ONDANSETRON HCL 4 MG/2ML IJ SOLN
INTRAMUSCULAR | Status: DC | PRN
  Administered 2021-02-14: 4 mg via INTRAVENOUS

## 2021-02-14 MED ORDER — KETOROLAC TROMETHAMINE 30 MG/ML IJ SOLN
INTRAMUSCULAR | Status: AC
  Filled 2021-02-14: qty 1

## 2021-02-14 MED ORDER — HYDROMORPHONE HCL 1 MG/ML IJ SOLN: 0.2500 mg | INTRAMUSCULAR | Status: DC | PRN

## 2021-02-14 MED ORDER — OXYCODONE HCL 5 MG PO TABS: 5.0000 mg | ORAL_TABLET | Freq: Once | ORAL | Status: DC | PRN

## 2021-02-14 MED ORDER — LIDOCAINE 2% (20 MG/ML) 5 ML SYRINGE
INTRAMUSCULAR | Status: AC
  Filled 2021-02-14: qty 5

## 2021-02-14 MED ORDER — VANCOMYCIN HCL 500 MG IV SOLR
INTRAVENOUS | Status: DC | PRN
  Administered 2021-02-14: 500 mg via TOPICAL

## 2021-02-14 MED ORDER — AMISULPRIDE (ANTIEMETIC) 5 MG/2ML IV SOLN: 10.0000 mg | Freq: Once | INTRAVENOUS | Status: DC | PRN

## 2021-02-14 MED ORDER — LIDOCAINE 2% (20 MG/ML) 5 ML SYRINGE
INTRAMUSCULAR | Status: DC | PRN
  Administered 2021-02-14: 40 mg via INTRAVENOUS

## 2021-02-14 MED ORDER — CHLORHEXIDINE GLUCONATE 0.12 % MT SOLN: 15.0000 mL | Freq: Once | OROMUCOSAL | Status: AC

## 2021-02-14 MED ORDER — GABAPENTIN 300 MG PO CAPS: 300.0000 mg | ORAL_CAPSULE | Freq: Once | ORAL | Status: AC

## 2021-02-14 MED ORDER — DIPHENHYDRAMINE HCL 50 MG/ML IJ SOLN
INTRAMUSCULAR | Status: DC | PRN
  Administered 2021-02-14: 12.5 mg via INTRAVENOUS

## 2021-02-14 MED ORDER — ACETAMINOPHEN 500 MG PO TABS: 1000.0000 mg | ORAL_TABLET | Freq: Once | ORAL | Status: AC

## 2021-02-14 MED ORDER — PROPOFOL 10 MG/ML IV BOLUS
INTRAVENOUS | Status: AC
  Filled 2021-02-14: qty 20

## 2021-02-14 MED ORDER — SUGAMMADEX SODIUM 200 MG/2ML IV SOLN
INTRAVENOUS | Status: DC | PRN
  Administered 2021-02-14: 110 mg via INTRAVENOUS

## 2021-02-14 MED ORDER — PHENYLEPHRINE HCL-NACL 20-0.9 MG/250ML-% IV SOLN
INTRAVENOUS | Status: DC | PRN
  Administered 2021-02-14: 20 ug/min via INTRAVENOUS

## 2021-02-14 MED ORDER — MEPERIDINE HCL 25 MG/ML IJ SOLN: 6.2500 mg | INTRAMUSCULAR | Status: DC | PRN

## 2021-02-14 SURGICAL SUPPLY — 61 items
ANCHOR KNOTLESS SUT DX #2 (Anchor) ×3 IMPLANT
BAG COUNTER SPONGE SURGICOUNT (BAG) IMPLANT
BLADE AVERAGE 25X9 (BLADE) IMPLANT
BLADE SURG 11 STRL SS (BLADE) ×2 IMPLANT
BNDG COHESIVE 4X5 TAN STRL (GAUZE/BANDAGES/DRESSINGS) IMPLANT
CANNULA PASSPORT BUTTON 10-40 (CANNULA) ×2 IMPLANT
CHLORAPREP W/TINT 26 (MISCELLANEOUS) ×2 IMPLANT
CLSR STERI-STRIP ANTIMIC 1/2X4 (GAUZE/BANDAGES/DRESSINGS) ×2 IMPLANT
DECANTER SPIKE VIAL GLASS SM (MISCELLANEOUS) IMPLANT
DRAPE C-ARM 42X72 X-RAY (DRAPES) ×2 IMPLANT
DRAPE IMP U-DRAPE 54X76 (DRAPES) ×2 IMPLANT
DRAPE STERI 35X30 U-POUCH (DRAPES) ×2 IMPLANT
DRAPE U-SHAPE 76X120 STRL (DRAPES) ×4 IMPLANT
DRSG AQUACEL AG ADV 3.5X 6 (GAUZE/BANDAGES/DRESSINGS) IMPLANT
DRSG PAD ABDOMINAL 8X10 ST (GAUZE/BANDAGES/DRESSINGS) ×6 IMPLANT
DRSG XEROFORM 1X8 (GAUZE/BANDAGES/DRESSINGS) ×1 IMPLANT
DW OUTFLOW CASSETTE/TUBE SET (MISCELLANEOUS) ×2 IMPLANT
ELECT REM PT RETURN 9FT ADLT (ELECTROSURGICAL) ×2
ELECTRODE REM PT RTRN 9FT ADLT (ELECTROSURGICAL) ×1 IMPLANT
EXCALIBUR CVD 4.0MM X 13CM (MISCELLANEOUS) ×2 IMPLANT
GAUZE SPONGE 4X4 12PLY STRL (GAUZE/BANDAGES/DRESSINGS) ×2 IMPLANT
GAUZE SPONGE 4X4 12PLY STRL LF (GAUZE/BANDAGES/DRESSINGS) ×1 IMPLANT
GLOVE SRG 8 PF TXTR STRL LF DI (GLOVE) ×1 IMPLANT
GLOVE SURG ENC MOIS LTX SZ6.5 (GLOVE) ×2 IMPLANT
GLOVE SURG ENC MOIS LTX SZ8 (GLOVE) ×2 IMPLANT
GLOVE SURG LTX SZ8 (GLOVE) ×2 IMPLANT
GLOVE SURG UNDER POLY LF SZ6.5 (GLOVE) ×2 IMPLANT
GLOVE SURG UNDER POLY LF SZ8 (GLOVE) ×1
GOWN STRL REUS W/ TWL LRG LVL3 (GOWN DISPOSABLE) ×2 IMPLANT
GOWN STRL REUS W/TWL LRG LVL3 (GOWN DISPOSABLE) ×2
GOWN STRL REUS W/TWL XL LVL3 (GOWN DISPOSABLE) ×2 IMPLANT
IMMOBILIZER KNEE 24 THIGH 36 (MISCELLANEOUS) IMPLANT
IMMOBILIZER KNEE 24 UNIV (MISCELLANEOUS) ×2
KIT BASIN OR (CUSTOM PROCEDURE TRAY) ×2 IMPLANT
KIT FIBERTAK DX KNTLS DISP (KITS) ×2 IMPLANT
KIT STABILIZATION SHOULDER (MISCELLANEOUS) ×2 IMPLANT
LASSO 90 CVE QUICKPAS (DISPOSABLE) IMPLANT
NDL SPNL 18GX3.5 QUINCKE PK (NEEDLE) ×1 IMPLANT
NEEDLE SPNL 18GX3.5 QUINCKE PK (NEEDLE) ×2 IMPLANT
PACK ARTHROSCOPY DSU (CUSTOM PROCEDURE TRAY) ×2 IMPLANT
PAD ABD 8X10 STRL (GAUZE/BANDAGES/DRESSINGS) ×1 IMPLANT
PORT APPOLLO RF 90DEGREE MULTI (SURGICAL WAND) ×2 IMPLANT
RESTRAINT HEAD UNIVERSAL NS (MISCELLANEOUS) ×2 IMPLANT
SHEET MEDIUM DRAPE 40X70 STRL (DRAPES) ×2 IMPLANT
SLEEVE ARM SUSPENSION SYSTEM (MISCELLANEOUS) ×1 IMPLANT
SLING ARM FOAM STRAP LRG (SOFTGOODS) IMPLANT
SUT FIBERWIRE #2 38 T-5 BLUE (SUTURE)
SUT FIBERWIRE 2-0 18 17.9 3/8 (SUTURE)
SUT MNCRL AB 4-0 PS2 18 (SUTURE) ×2 IMPLANT
SUT VIC AB 0 CT1 18XCR BRD 8 (SUTURE) ×1 IMPLANT
SUT VIC AB 0 CT1 8-18 (SUTURE) ×1
SUT VIC AB 3-0 SH 27 (SUTURE) ×1
SUT VIC AB 3-0 SH 27X BRD (SUTURE) ×1 IMPLANT
SUTURE FIBERWR #2 38 T-5 BLUE (SUTURE) IMPLANT
SUTURE FIBERWR 2-0 18 17.9 3/8 (SUTURE) IMPLANT
SYSTEM AC REPAIR LO-PRO KNTLS (Anchor) ×1 IMPLANT
TAPE CLOTH SURG 4X10 WHT LF (GAUZE/BANDAGES/DRESSINGS) ×1 IMPLANT
TOWEL GREEN STERILE FF (TOWEL DISPOSABLE) ×2 IMPLANT
TUBE CONNECTING 20X1/4 (TUBING) ×2 IMPLANT
TUBE SUCTION HIGH CAP CLEAR NV (SUCTIONS) IMPLANT
TUBING ARTHROSCOPY IRRIG 16FT (MISCELLANEOUS) ×2 IMPLANT

## 2021-02-14 NOTE — Discharge Instructions (Signed)
° ° °   Discharge Instructions    Attending Surgeon: Huel Cote, MD Office Phone Number: [phone]   Diagnosis and Procedures:    Surgeries Performed: Right shoulder [facility] joint reconstruction  Discharge Plan:    Diet: Resume usual diet. Begin with light or bland foods.  Drink plenty of fluids.  Activity:  Keep sling and dressing in place until your follow up visit in Physical Therapy You are advised to go home directly from the hospital or surgical center. Restrict your activities.  GENERAL INSTRUCTIONS: 1.  Keep your surgical site elevated above your heart for at least 5-7 days or longer to prevent swelling. This will improve your comfort and your overall recovery following surgery.     2. Please call Dr. Serena Croissant office at [phone] with questions Monday-Friday during business hours. If no one answers, please leave a message and someone should get back to the patient within 24 hours. For emergencies please call 911 or proceed to the emergency room.   3. Patient to notify surgical team if experiences any of the following: Bowel/Bladder dysfunction, uncontrolled pain, nerve/muscle weakness, incision with increased drainage or redness, nausea/vomiting and Fever greater than 101.0 F.  Be alert for signs of infection including redness, streaking, odor, fever or chills. Be alert for excessive pain or bleeding and notify your surgeon immediately.  WOUND INSTRUCTIONS:   Leave your dressing/cast/splint in place until your post operative visit.  Keep it clean and dry.  Always keep the incision clean and dry until the staples/sutures are removed. If there is no drainage from the incision you should keep it open to air. If there is drainage from the incision you must keep it covered at all times until the drainage stops  Do not soak in a bath tub, hot tub, pool, lake or other body of water until 21 days after your surgery and your incision is completely dry and healed.  If you have  removable sutures (or staples) they must be removed 10-14 days (unless otherwise instructed) from the day of your surgery.     1)  Elevate the extremity as much as possible.  2)  Keep the dressing clean and dry.  3)  Please call us if the dressing becomes wet or dirty.  4)  If you are experiencing worsening pain or worsening swelling, please call.     MEDICATIONS: Resume all previous home medications at the previous prescribed dose and frequency unless otherwise noted Start taking the  pain medications on an as-needed basis as prescribed  Please taper down pain medication over the next week following surgery.  Ideally you should not require a refill of any narcotic pain medication.  Take pain medication with food to minimize nausea. In addition to the prescribed pain medication, you may take over-the-counter pain relievers such as Tylenol.  Do NOT take additional tylenol if your pain medication already has tylenol in it.  Aspirin 325mg  daily for four weeks.      FOLLOWUP INSTRUCTIONS: 1. Follow up at the Physical Therapy Clinic 3-4 days following surgery. This appointment should be scheduled unless other arrangements have been made.The Physical Therapy scheduling number is [phone] if an appointment has not already been arranged.  2. Contact Dr. [phone] office during office hours at [phone] or the practice after hours line at [phone] for non-emergencies. For medical emergencies call 911.   Discharge Location: Home

## 2021-02-14 NOTE — Op Note (Signed)
Date of Surgery: 02/14/2021  INDICATIONS: Morgan Beltran is a 25 y.o.-year-old female with persistent symptomatic right acromioclavicular joint.  The risk and benefits of the procedure with discussed in detail and documented in the pre-operative evaluation.  PREOPERATIVE DIAGNOSIS: 1. Right open acromioclavicular joint separation, grade 3  POSTOPERATIVE DIAGNOSIS: Same.  PROCEDURE: 1. Right open acromioclavicular joint reconstruction 2. Right shoulder arthroscopic limited debridement  SURGEON: Benancio Deeds MD  ASSISTANT: Kerby Less, ATC; necessary for the timely completion of procedure and due to complexity of procedure.  ANESTHESIA:  general + block  IV FLUIDS AND URINE: See anesthesia record.  ANTIBIOTICS: Ancef 2g  ESTIMATED BLOOD LOSS: 25 mL.  IMPLANTS:  Implant Name Type Inv. Item Serial No. Manufacturer Lot No. LRB No. Used Action  SYSTEM AC REPAIR LO-PRO KNTLS - [id number] Anchor SYSTEM AC REPAIR LO-PRO KNTLS  [employer]  Right 1 Implanted    DRAINS: None  CULTURES: None  COMPLICATIONS: none  DESCRIPTION OF PROCEDURE:  DESCRIPTION OF PROCEDURE:      Arthroscopic findings demonstrated:   Glenoid cartilage: Normal Humeral head: Normal Labrum: Normal Biceps insertion: Normal Biceps tendon: Normal Subscapularis insertion: Normal Rotator cuff: Normal     The patient was identified in the preoperative holding area and the correct site was marked according to universal protocol with nursing.  Anesthesia performed a peripheral nerve block.  She subsequently taken back to the operating room.    Anesthesia was induced. She was positioned in the lateral position and all bony prominences were padded.  The beachchair was set up.  He was prepped and draped in the usual sterile fashion.  We began with an approach to the [facility] joint with an incision in line with the clavicle and AC joint.  Skin was incised with 15 blade.  Electrocautery was used to dissect down to the [facility]  joint.  A cuff of tissue was elevated anterior and posterior on the [facility] joint.  Hematoma was irrigated.  At this time a provisional reduction was performed using 2 DEX fibertak knotless.  The free tissue stitch was taken through the anterior leaflet of the acromioclavicular fascia and then placed into the acromial anchor.  This provided a good provisional reduction.    Attention was then turned to the arthroscopic portion of the case.  A posterior portal was created with a 11 blade.  Diagnostic arthroscopy ensued with the findings above.  An anterior portal and anterior lateral accessory portal were then established under direct visualization.  The visualization was switched to the anterior portal.  At this time the electrocautery was brought in with the anterior lateral portal and the coracoid was skeletonized.  The rotator interval was debrided as well as the superior and inferior aspects of the coracoid with the electrocautery.  The A/C joint guide from [employer] was brought into the anterior lateral portal under the coracoid and this was confirmed on fluoroscopy to be well positioned central in the coracoid.  The provisional wire was drilled through the clavicle and then the coracoid again confirmed under fluoroscopy.  The drill was then used over the guidepin to go through the outer cortex of the clavicle.  The nitinol wire was placed through this guide.  And taken out the anterior lateral portal.  We subsequently passed the proximal portion of the acromioclavicular dog bone device through this nitinol wire and this was pulled retrograde up and through the clavicle.  The dog bone was placed on the distal portion of the device and this  was shuttled into the joint under the coracoid  Under direct fluoroscopic visualization we then tensioned the top at with anatomic reduction of the [facility] joint.  Fluoroscopic visualization confirmed the dog bone was directly under the coracoid with good tension.  At this point we  thoroughly irrigated the [facility] joint and a layered closure was performed using 0 Vicryl to reapproximate the anterior and posterior leaflets.  2-0 Vicryl was then used and the running 3-0 Monocryl was used for skin.  Portals were closed again with buried 3-0 Monocryl dry dressing with gauze and Tegaderm was applied       [employer] ATC was necessary for opening, closing, retracting, limb positioning and overall facilitation and timely completion of the procedure.     Kerby Less ATC was necessary for opening, closing, retracting, limb positioning and overall facilitation and timely completion of the procedure.     POSTOPERATIVE PLAN: She will be nonweightbearing in a sling for 2 weeks.  At that time we will allow her for early passive as well as active range of motion.  I will see her back in 2 weeks for suture removal  Benancio Deeds, MD 4:27 PM

## 2021-02-14 NOTE — Anesthesia Postprocedure Evaluation (Signed)
Anesthesia Post Note  Patient: Morgan Beltran  Procedure(s) Performed: RIGHT SHOULDER ACROMIOCLAVICULAR JOINT RECONSTRUCTION WITH ARTHROSCOPIC GUIDE (Right: Shoulder)     Patient location during evaluation: PACU Anesthesia Type: Regional Level of consciousness: awake and alert Pain management: pain level controlled Vital Signs Assessment: post-procedure vital signs reviewed and stable Respiratory status: spontaneous breathing, nonlabored ventilation, respiratory function stable and patient connected to nasal cannula oxygen Cardiovascular status: blood pressure returned to baseline and stable Postop Assessment: no apparent nausea or vomiting Anesthetic complications: no   No notable events documented.  Last Vitals:  Vitals:   02/14/21 1620 02/14/21 1635  BP: 91/73 94/61  Pulse: 73 67  Resp: 15 16  Temp:  37 C  SpO2: 100% 100%    Last Pain:  Vitals:   02/14/21 1635  TempSrc:   PainSc: 0-No pain                 Shelton Silvas

## 2021-02-14 NOTE — Brief Op Note (Signed)
° °  Brief Op Note  Date of Surgery: 02/14/2021  Preoperative Diagnosis: right acromioclavicular separation  Postoperative Diagnosis: same  Procedure: Procedure(s): RIGHT SHOULDER ACROMIOCLAVICULAR JOINT RECONSTRUCTION WITH ARTHROSCOPIC GUIDE  Implants: Implant Name Type Inv. Item Serial No. Manufacturer Lot No. LRB No. Used Action  SYSTEM AC REPAIR LO-PRO KNTLS - [id number] Anchor SYSTEM AC REPAIR LO-PRO KNTLS  [employer]  Right 1 Implanted    Surgeons: Surgeon(s): Huel Cote, MD  Anesthesia: Monitor Anesthesia Care    Estimated Blood Loss: See anesthesia record  Complications: None  Condition to PACU: Stable  Benancio Deeds, MD 02/14/2021 4:26 PM

## 2021-02-14 NOTE — Transfer of Care (Signed)
Immediate Anesthesia Transfer of Care Note  Patient: Morgan Beltran  Procedure(s) Performed: RIGHT SHOULDER ACROMIOCLAVICULAR JOINT RECONSTRUCTION WITH ARTHROSCOPIC GUIDE (Right: Shoulder)  Patient Location: PACU  Anesthesia Type:General and Regional  Level of Consciousness: awake and alert   Airway & Oxygen Therapy: Patient Spontanous Breathing and Patient connected to face mask oxygen  Post-op Assessment: Report given to RN and Post -op Vital signs reviewed and stable  Post vital signs: Reviewed and stable  Last Vitals:  Vitals Value Taken Time  BP 102/55 02/14/21 1605  Temp 36.7 C 02/14/21 1605  Pulse 79 02/14/21 1608  Resp 17 02/14/21 1608  SpO2 100 % 02/14/21 1608  Vitals shown include unvalidated device data.  Last Pain:  Vitals:   02/14/21 1605  TempSrc:   PainSc: Asleep         Complications: No notable events documented.

## 2021-02-14 NOTE — Interval H&P Note (Signed)
History and Physical Interval Note:  02/14/2021 12:11 PM  Morgan Beltran  has presented today for surgery, with the diagnosis of right acromioclavicular separation.  The various methods of treatment have been discussed with the patient and family. After consideration of risks, benefits and other options for treatment, the patient has consented to  Procedure(s): RIGHT SHOULDER ACROMIOCLAVICULAR JOINT RECONSTRUCTION WITH ARTHROSCOPIC GUIDE (Right) as a surgical intervention.  The patient's history has been reviewed, patient examined, no change in status, stable for surgery.  I have reviewed the patient's chart and labs.  Questions were answered to the patient's satisfaction.     Huel Cote
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
## 2021-02-14 NOTE — Anesthesia Procedure Notes (Signed)
Procedure Name: Intubation Date/Time: 02/14/2021 12:52 PM Performed by: Reece Agar, CRNA Pre-anesthesia Checklist: Patient identified, Emergency Drugs available, Suction available and Patient being monitored Patient Re-evaluated:Patient Re-evaluated prior to induction Oxygen Delivery Method: Circle System Utilized Preoxygenation: Pre-oxygenation with 100% oxygen Induction Type: IV induction Ventilation: Mask ventilation without difficulty Laryngoscope Size: Mac and 3 Grade View: Grade I Tube type: Oral Number of attempts: 1 Airway Equipment and Method: Stylet Placement Confirmation: ETT inserted through vocal cords under direct vision, positive ETCO2 and breath sounds checked- equal and bilateral Secured at: 22 cm Tube secured with: Tape Dental Injury: Teeth and Oropharynx as per pre-operative assessment

## 2021-02-14 NOTE — Anesthesia Procedure Notes (Signed)
Anesthesia Regional Block: Interscalene brachial plexus block   Pre-Anesthetic Checklist: , timeout performed,  Correct Patient, Correct Site, Correct Laterality,  Correct Procedure, Correct Position, site marked,  Risks and benefits discussed,  Surgical consent,  Pre-op evaluation,  At surgeon's request and post-op pain management  Laterality: Right  Prep: Maximum Sterile Barrier Precautions used, chloraprep       Needles:  Injection technique: Single-shot  Needle Type: Echogenic Stimulator Needle     Needle Length: 9cm  Needle Gauge: 22     Additional Needles:   Procedures:,,,, ultrasound used (permanent image in chart),,    Narrative:  Start time: 02/14/2021 11:50 AM End time: 02/14/2021 12:00 PM Injection made incrementally with aspirations every 5 mL.  Performed by: Personally  Anesthesiologist: Lannie Fields, DO  Additional Notes: Monitors applied. No increased pain on injection. No increased resistance to injection. Injection made in 5cc increments. Good needle visualization. Patient tolerated procedure well.

## 2021-02-15 ENCOUNTER — Ambulatory Visit: Payer: Medicaid Other

## 2021-02-15 ENCOUNTER — Ambulatory Visit: Payer: Medicaid Other | Admitting: Physical Therapy

## 2021-02-15 ENCOUNTER — Other Ambulatory Visit: Payer: Self-pay | Admitting: *Deleted

## 2021-02-15 ENCOUNTER — Ambulatory Visit: Payer: Medicaid Other | Admitting: Occupational Therapy

## 2021-02-15 DIAGNOSIS — R55 Syncope and collapse: Secondary | ICD-10-CM

## 2021-02-15 DIAGNOSIS — Z8673 Personal history of transient ischemic attack (TIA), and cerebral infarction without residual deficits: Secondary | ICD-10-CM

## 2021-02-15 NOTE — Progress Notes (Unsigned)
[id number] applied in office 01/31/21

## 2021-02-16 ENCOUNTER — Encounter (HOSPITAL_COMMUNITY): Payer: Self-pay | Admitting: Orthopaedic Surgery

## 2021-02-21 ENCOUNTER — Telehealth: Payer: Self-pay

## 2021-02-21 ENCOUNTER — Telehealth (HOSPITAL_BASED_OUTPATIENT_CLINIC_OR_DEPARTMENT_OTHER): Payer: Self-pay | Admitting: Orthopaedic Surgery

## 2021-02-21 NOTE — Telephone Encounter (Signed)
Returning call from 02/21/21

## 2021-02-21 NOTE — Telephone Encounter (Signed)
Unable to leave VM. Sent pt message on MyChart

## 2021-02-21 NOTE — Telephone Encounter (Signed)
Patient calling asking if she can remove post surgical bandages

## 2021-02-27 ENCOUNTER — Other Ambulatory Visit (HOSPITAL_BASED_OUTPATIENT_CLINIC_OR_DEPARTMENT_OTHER): Payer: Self-pay | Admitting: Orthopaedic Surgery

## 2021-02-27 DIAGNOSIS — S43101A Unspecified dislocation of right acromioclavicular joint, initial encounter: Secondary | ICD-10-CM

## 2021-02-28 ENCOUNTER — Other Ambulatory Visit: Payer: Self-pay

## 2021-02-28 ENCOUNTER — Ambulatory Visit (INDEPENDENT_AMBULATORY_CARE_PROVIDER_SITE_OTHER): Payer: Medicaid Other | Admitting: Orthopaedic Surgery

## 2021-02-28 ENCOUNTER — Ambulatory Visit (HOSPITAL_BASED_OUTPATIENT_CLINIC_OR_DEPARTMENT_OTHER)
Admission: RE | Admit: 2021-02-28 | Discharge: 2021-02-28 | Disposition: A | Discharge status: Home / Self Care | Payer: Medicaid Other | Source: Ambulatory Visit | Attending: Orthopaedic Surgery | Admitting: Orthopaedic Surgery

## 2021-02-28 ENCOUNTER — Ambulatory Visit: Payer: Medicaid Other | Admitting: Physical Therapy

## 2021-02-28 ENCOUNTER — Encounter: Payer: Medicaid Other | Admitting: Occupational Therapy

## 2021-02-28 DIAGNOSIS — S43101A Unspecified dislocation of right acromioclavicular joint, initial encounter: Secondary | ICD-10-CM | POA: Diagnosis not present
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
## 2021-03-02 ENCOUNTER — Ambulatory Visit (HOSPITAL_BASED_OUTPATIENT_CLINIC_OR_DEPARTMENT_OTHER): Payer: Medicaid Other | Attending: Orthopaedic Surgery | Admitting: Physical Therapy

## 2021-03-02 ENCOUNTER — Other Ambulatory Visit: Payer: Self-pay

## 2021-03-02 ENCOUNTER — Encounter (HOSPITAL_BASED_OUTPATIENT_CLINIC_OR_DEPARTMENT_OTHER): Payer: Self-pay | Admitting: Physical Therapy

## 2021-03-02 DIAGNOSIS — M25611 Stiffness of right shoulder, not elsewhere classified: Secondary | ICD-10-CM | POA: Diagnosis not present

## 2021-03-02 DIAGNOSIS — S43101A Unspecified dislocation of right acromioclavicular joint, initial encounter: Secondary | ICD-10-CM | POA: Diagnosis present

## 2021-03-02 DIAGNOSIS — M6281 Muscle weakness (generalized): Secondary | ICD-10-CM | POA: Insufficient documentation

## 2021-03-02 DIAGNOSIS — X58XXXA Exposure to other specified factors, initial encounter: Secondary | ICD-10-CM | POA: Insufficient documentation

## 2021-03-02 DIAGNOSIS — M25511 Pain in right shoulder: Secondary | ICD-10-CM | POA: Diagnosis not present
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
## 2021-03-07 ENCOUNTER — Encounter: Payer: Self-pay | Admitting: Rehabilitation

## 2021-03-07 ENCOUNTER — Ambulatory Visit: Payer: Medicaid Other | Admitting: Physical Therapy

## 2021-03-07 ENCOUNTER — Ambulatory Visit (HOSPITAL_BASED_OUTPATIENT_CLINIC_OR_DEPARTMENT_OTHER): Payer: Medicaid Other | Admitting: Physical Therapy

## 2021-03-07 ENCOUNTER — Encounter: Payer: Medicaid Other | Admitting: Occupational Therapy

## 2021-03-07 DIAGNOSIS — R2689 Other abnormalities of gait and mobility: Secondary | ICD-10-CM
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
## 2021-03-07 NOTE — Therapy (Signed)
[facility] [address] [place], [place], [address] Phone: [phone]   Fax:  [phone]  Patient Details  Name: Morgan Beltran MRN: [id number] Date of Birth: [birth year] Referring Provider:  No ref. provider found  Encounter Date: 03/07/2021   PHYSICAL THERAPY DISCHARGE SUMMARY  Visits from Start of Care: 2  Current functional level related to goals / functional outcomes: See [facility] PTA notes for last LTGs from OP neuro    Remaining deficits: Unsure as she did not return for follow up PT and then had shoulder surgery and is now being seen at another clinic for PT.    Education / Equipment: HEP    Patient agrees to discharge. Patient goals were not met. Patient is being discharged due to a change in medical status.    Cameron Sprang, PT, MPT [facility] [address] [place], [place], [address] Phone: [phone]   Fax:  [phone] 03/07/21, 11:06 AM

## 2021-03-09 ENCOUNTER — Other Ambulatory Visit: Payer: Self-pay

## 2021-03-09 ENCOUNTER — Emergency Department (HOSPITAL_COMMUNITY)
Admission: EM | Admit: 2021-03-09 | Discharge: 2021-03-09 | Disposition: A | Discharge status: Home / Self Care | Payer: Medicaid Other | Attending: Emergency Medicine | Admitting: Emergency Medicine

## 2021-03-09 ENCOUNTER — Emergency Department (HOSPITAL_COMMUNITY): Payer: Medicaid Other

## 2021-03-09 DIAGNOSIS — Z79899 Other long term (current) drug therapy: Secondary | ICD-10-CM | POA: Insufficient documentation

## 2021-03-09 DIAGNOSIS — R519 Headache, unspecified: Secondary | ICD-10-CM | POA: Diagnosis not present

## 2021-03-09 DIAGNOSIS — Z7982 Long term (current) use of aspirin: Secondary | ICD-10-CM | POA: Diagnosis not present

## 2021-03-09 DIAGNOSIS — Z20822 Contact with and (suspected) exposure to covid-19: Secondary | ICD-10-CM | POA: Insufficient documentation

## 2021-03-09 DIAGNOSIS — R55 Syncope and collapse: Secondary | ICD-10-CM

## 2021-03-09 LAB — DIFFERENTIAL
Abs Immature Granulocytes: 0.01 10*3/uL (ref 0.00–0.07)
Basophils Absolute: 0 10*3/uL (ref 0.0–0.1)
Basophils Relative: 0 %
Eosinophils Absolute: 0.1 10*3/uL (ref 0.0–0.5)
Eosinophils Relative: 2 %
Immature Granulocytes: 0 %
Lymphocytes Relative: 41 %
Lymphs Abs: 3 10*3/uL (ref 0.7–4.0)
Monocytes Absolute: 0.7 10*3/uL (ref 0.1–1.0)
Monocytes Relative: 9 %
Neutro Abs: 3.4 10*3/uL (ref 1.7–7.7)
Neutrophils Relative %: 48 %

## 2021-03-09 LAB — COMPREHENSIVE METABOLIC PANEL
ALT: 17 U/L (ref 0–44)
AST: 20 U/L (ref 15–41)
Albumin: 4.1 g/dL (ref 3.5–5.0)
Alkaline Phosphatase: 58 U/L (ref 38–126)
Anion gap: 11 (ref 5–15)
BUN: 16 mg/dL (ref 6–20)
CO2: 21 mmol/L — ABNORMAL LOW (ref 22–32)
Calcium: 9.2 mg/dL (ref 8.9–10.3)
Chloride: 109 mmol/L (ref 98–111)
Creatinine, Ser: 0.77 mg/dL (ref 0.44–1.00)
GFR, Estimated: >60 mL/min (ref >60)
Glucose, Bld: 121 mg/dL — ABNORMAL HIGH (ref 70–99)
Potassium: 3.8 mmol/L (ref 3.5–5.1)
Sodium: 141 mmol/L (ref 135–145)
Total Bilirubin: 0.3 mg/dL (ref 0.3–1.2)
Total Protein: 7 g/dL (ref 6.5–8.1)

## 2021-03-09 LAB — I-STAT CHEM 8, ED
BUN: 17 mg/dL (ref 6–20)
Calcium, Ion: 1.08 mmol/L — ABNORMAL LOW (ref 1.15–1.40)
Chloride: 110 mmol/L (ref 98–111)
Creatinine, Ser: 0.7 mg/dL (ref 0.44–1.00)
Glucose, Bld: 120 mg/dL — ABNORMAL HIGH (ref 70–99)
HCT: 37 % (ref 36.0–46.0)
Hemoglobin: 12.6 g/dL (ref 12.0–15.0)
Potassium: 3.7 mmol/L (ref 3.5–5.1)
Sodium: 141 mmol/L (ref 135–145)
TCO2: 22 mmol/L (ref 22–32)

## 2021-03-09 LAB — CBC
HCT: 37.3 % (ref 36.0–46.0)
Hemoglobin: 12.5 g/dL (ref 12.0–15.0)
MCH: 31 pg (ref 26.0–34.0)
MCHC: 33.5 g/dL (ref 30.0–36.0)
MCV: 92.6 fL (ref 80.0–100.0)
Platelets: 272 10*3/uL (ref 150–400)
RBC: 4.03 MIL/uL (ref 3.87–5.11)
RDW: 12.2 % (ref 11.5–15.5)
WBC: 7.3 10*3/uL (ref 4.0–10.5)
nRBC: 0 % (ref 0.0–0.2)

## 2021-03-09 LAB — ETHANOL: Alcohol, Ethyl (B): <10 mg/dL (ref <10)

## 2021-03-09 LAB — RESP PANEL BY RT-PCR (FLU A&B, COVID) ARPGX2
Influenza A by PCR: NEGATIVE
Influenza B by PCR: NEGATIVE
SARS Coronavirus 2 by RT PCR: NEGATIVE

## 2021-03-09 LAB — I-STAT BETA HCG BLOOD, ED (MC, WL, AP ONLY): I-stat hCG, quantitative: <5 m[IU]/mL (ref <5)

## 2021-03-09 LAB — APTT: aPTT: 23 seconds — ABNORMAL LOW (ref 24–36)

## 2021-03-09 LAB — PROTIME-INR
INR: 1 (ref 0.8–1.2)
Prothrombin Time: 12.9 seconds (ref 11.4–15.2)

## 2021-03-09 LAB — CBG MONITORING, ED: Glucose-Capillary: 127 mg/dL — ABNORMAL HIGH (ref 70–99)
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
## 2021-03-09 MED ORDER — PROCHLORPERAZINE EDISYLATE 10 MG/2ML IJ SOLN
10.0000 mg | Freq: Once | INTRAMUSCULAR | Status: AC
  Administered 2021-03-09: 10 mg via INTRAVENOUS
  Filled 2021-03-09: qty 2

## 2021-03-09 MED ORDER — DIPHENHYDRAMINE HCL 50 MG/ML IJ SOLN
12.5000 mg | Freq: Once | INTRAMUSCULAR | Status: AC
  Administered 2021-03-09: 12.5 mg via INTRAVENOUS
  Filled 2021-03-09: qty 1

## 2021-03-09 NOTE — ED Triage Notes (Addendum)
Pt bib ems as code stroke. Pt had near syncopal episode and was altered for approx 45 mins per family. Pt had vision changes and slurred speech that have resolved upon arrival to ED. Pt states it feels like her last stroke that she had. Pt also c/o arm numbness that has also resolved. VSS per EMS.   BP: 109/60
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
## 2021-03-09 NOTE — Code Documentation (Signed)
Stroke Response Nurse Documentation Code Documentation  Morgan Beltran is a 26 y.o. female arriving to [facility] ED via [facility] on 1/20 with past medical hx of CVA, PFO. On aspirin 81 mg daily. Code stroke was activated by EMS.   Patient from home where she was LKW at 2300 and now complaining of slurred speech and AMS.  Stroke team at the bedside on patient arrival. Labs drawn and patient cleared for CT by Dr. Waverly Ferrari. Patient to CT with team. NIHSS 0, see documentation for details and code stroke times. Patient with no deficits on exam. The following imaging was completed:  CT, MRI. Patient is not a candidate for IV Thrombolytic due to CVA one month ago. Patient is not a candidate for IR due to No LVO.   Bedside handoff with ED RN Shirlee Limerick.    Madelynn Done  Rapid Response RN

## 2021-03-09 NOTE — ED Notes (Signed)
Patient transported to MRI 
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
## 2021-03-16 ENCOUNTER — Other Ambulatory Visit: Payer: Self-pay

## 2021-03-16 ENCOUNTER — Encounter (HOSPITAL_BASED_OUTPATIENT_CLINIC_OR_DEPARTMENT_OTHER): Payer: Self-pay | Admitting: Physical Therapy

## 2021-03-16 ENCOUNTER — Encounter: Payer: Self-pay | Admitting: Adult Health

## 2021-03-16 ENCOUNTER — Ambulatory Visit (HOSPITAL_BASED_OUTPATIENT_CLINIC_OR_DEPARTMENT_OTHER): Payer: Medicaid Other | Admitting: Physical Therapy

## 2021-03-16 DIAGNOSIS — M25611 Stiffness of right shoulder, not elsewhere classified: Secondary | ICD-10-CM

## 2021-03-16 DIAGNOSIS — S43101A Unspecified dislocation of right acromioclavicular joint, initial encounter: Secondary | ICD-10-CM | POA: Diagnosis not present

## 2021-03-16 DIAGNOSIS — M6281 Muscle weakness (generalized): Secondary | ICD-10-CM

## 2021-03-16 DIAGNOSIS — M25511 Pain in right shoulder: Secondary | ICD-10-CM
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
## 2021-03-19 NOTE — Telephone Encounter (Signed)
Patient with moderate cognitive impairment post stroke at prior visit. She was advised not to drive from our standpoint due to this concern. Looks like recent episode of syncope which would also be a concern if she were to return back to driving - this can be further discussed with cards. Also, she has an appt scheduled in March with me - can we please have this with Dr. Pearlean Brownie due to multiple concerns at prior visit in regards to stroke etiology and recurrent syncope (she was advised to f/u with neuro after recent ED eval 1/20 for syncopal event). Thank you.
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
## 2021-03-22 ENCOUNTER — Telehealth (HOSPITAL_BASED_OUTPATIENT_CLINIC_OR_DEPARTMENT_OTHER): Payer: Self-pay | Admitting: Physical Therapy

## 2021-03-22 ENCOUNTER — Ambulatory Visit (HOSPITAL_BASED_OUTPATIENT_CLINIC_OR_DEPARTMENT_OTHER): Payer: Medicaid Other | Attending: Orthopaedic Surgery | Admitting: Physical Therapy

## 2021-03-22 DIAGNOSIS — M25611 Stiffness of right shoulder, not elsewhere classified: Secondary | ICD-10-CM | POA: Insufficient documentation

## 2021-03-22 DIAGNOSIS — M25511 Pain in right shoulder: Secondary | ICD-10-CM | POA: Insufficient documentation

## 2021-03-22 DIAGNOSIS — M6281 Muscle weakness (generalized): Secondary | ICD-10-CM | POA: Insufficient documentation

## 2021-03-22 DIAGNOSIS — S43101A Unspecified dislocation of right acromioclavicular joint, initial encounter: Secondary | ICD-10-CM | POA: Insufficient documentation

## 2021-03-22 NOTE — Telephone Encounter (Signed)
LVM informing pt of N/S visit today. Pt advised to reach out to front desk for scheduling as she currently does not have follow up treatments.   Zebedee Iba PT, DPT 03/22/21 10:50 AM

## 2021-03-28 ENCOUNTER — Encounter (HOSPITAL_BASED_OUTPATIENT_CLINIC_OR_DEPARTMENT_OTHER): Payer: Medicaid Other | Admitting: Orthopaedic Surgery

## 2021-03-30 ENCOUNTER — Encounter (HOSPITAL_BASED_OUTPATIENT_CLINIC_OR_DEPARTMENT_OTHER): Payer: Medicaid Other | Admitting: Orthopaedic Surgery

## 2021-04-25 ENCOUNTER — Ambulatory Visit: Payer: Medicaid Other | Admitting: Adult Health

## 2021-05-01 ENCOUNTER — Other Ambulatory Visit (HOSPITAL_COMMUNITY)
Admission: RE | Admit: 2021-05-01 | Discharge: 2021-05-01 | Disposition: A | Discharge status: Home / Self Care | Payer: Medicaid Other | Source: Ambulatory Visit | Attending: Obstetrics & Gynecology | Admitting: Obstetrics & Gynecology

## 2021-05-01 ENCOUNTER — Other Ambulatory Visit: Payer: Self-pay

## 2021-05-01 ENCOUNTER — Ambulatory Visit (INDEPENDENT_AMBULATORY_CARE_PROVIDER_SITE_OTHER): Payer: Medicaid Other | Admitting: Obstetrics & Gynecology

## 2021-05-01 VITALS — BP 108/73 | HR 73 | Wt 131.4 lb

## 2021-05-01 DIAGNOSIS — Z124 Encounter for screening for malignant neoplasm of cervix: Secondary | ICD-10-CM

## 2021-05-01 DIAGNOSIS — B3731 Acute candidiasis of vulva and vagina: Secondary | ICD-10-CM

## 2021-05-01 DIAGNOSIS — N760 Acute vaginitis: Secondary | ICD-10-CM | POA: Insufficient documentation

## 2021-05-01 MED ORDER — HYLAFEM VA SUPP: 1.0000 | VAGINAL | 0 refills | Status: AC

## 2021-05-01 MED ORDER — METRONIDAZOLE 0.75 % VA GEL: 1.0000 | Freq: Every day | VAGINAL | 5 refills | Status: DC
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
## 2021-05-02 LAB — CERVICOVAGINAL ANCILLARY ONLY
Bacterial Vaginitis (gardnerella): POSITIVE — AB
Candida Glabrata: NEGATIVE
Candida Vaginitis: POSITIVE — AB
Chlamydia: NEGATIVE
Comment: NEGATIVE
Comment: NEGATIVE
Comment: NEGATIVE
Comment: NEGATIVE
Comment: NEGATIVE
Comment: NORMAL
Neisseria Gonorrhea: NEGATIVE
Trichomonas: NEGATIVE

## 2021-05-02 LAB — CYTOLOGY - PAP: Diagnosis: NEGATIVE

## 2021-05-02 MED ORDER — FLUCONAZOLE 150 MG PO TABS: 150.0000 mg | ORAL_TABLET | Freq: Once | ORAL | 3 refills | Status: AC

## 2021-05-02 NOTE — Addendum Note (Signed)
Addended by: Jaynie Collins A on: 05/02/2021 11:56 AM ? ? Modules accepted: Orders ? ?

## 2021-05-07 ENCOUNTER — Ambulatory Visit (INDEPENDENT_AMBULATORY_CARE_PROVIDER_SITE_OTHER): Payer: Medicaid Other | Admitting: Neurology

## 2021-05-07 ENCOUNTER — Encounter: Payer: Self-pay | Admitting: Neurology

## 2021-05-07 VITALS — BP 103/72 | HR 91 | Ht 60.0 in | Wt 126.8 lb

## 2021-05-07 DIAGNOSIS — R55 Syncope and collapse: Secondary | ICD-10-CM | POA: Diagnosis not present

## 2021-05-07 DIAGNOSIS — Q2112 Patent foramen ovale: Secondary | ICD-10-CM

## 2021-05-07 DIAGNOSIS — I639 Cerebral infarction, unspecified: Secondary | ICD-10-CM
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
## 2021-05-07 NOTE — Progress Notes (Signed)
Copy for school ?

## 2021-05-10 ENCOUNTER — Telehealth: Payer: Self-pay | Admitting: Neurology

## 2021-05-10 NOTE — Telephone Encounter (Signed)
mcd [email or web address] no auth req-sent Lupita Leash a message she will reach out to the patient to schedule.  ?

## 2021-05-15 ENCOUNTER — Ambulatory Visit (HOSPITAL_COMMUNITY)
Admission: RE | Admit: 2021-05-15 | Discharge: 2021-05-15 | Disposition: A | Discharge status: Home / Self Care | Payer: Medicaid Other | Source: Ambulatory Visit | Attending: Neurology | Admitting: Neurology

## 2021-05-15 ENCOUNTER — Ambulatory Visit (HOSPITAL_BASED_OUTPATIENT_CLINIC_OR_DEPARTMENT_OTHER)
Admission: RE | Admit: 2021-05-15 | Discharge: 2021-05-15 | Disposition: A | Discharge status: Home / Self Care | Payer: Medicaid Other | Source: Ambulatory Visit

## 2021-05-15 ENCOUNTER — Other Ambulatory Visit: Payer: Self-pay

## 2021-05-15 DIAGNOSIS — R55 Syncope and collapse: Secondary | ICD-10-CM

## 2021-05-15 NOTE — Progress Notes (Signed)
TCD bubble study has been completed.  ? ?Preliminary results in CV Proc.  ? ?Favour Morgan Beltran ?05/15/2021 2:24 PM    ?

## 2021-05-15 NOTE — Procedures (Signed)
Patient Name: Morgan Beltran  ?MRN: [id number]  ?Epilepsy Attending: Lora Havens  ?Referring Physician/Provider: Garvin Fila, MD ?Date: 05/15/2021 ?Duration: 22.27 mins ? ?Patient history: 26 year old female with left PCA infarct and episodes of persistent blinking.  Patient also has episodes of syncope.  EEG evaluate for seizure. ? ?Level of alertness: Awake, asleep ? ?AEDs during EEG study: None ? ?Technical aspects: This EEG study was done with scalp electrodes positioned according to the 10-20 International system of electrode placement. Electrical activity was acquired at a sampling rate of 500Hz  and reviewed with a high frequency filter of 70Hz  and a low frequency filter of 1Hz . EEG data were recorded continuously and digitally stored.  ? ?Description: The posterior dominant rhythm consists of 9-10 Hz activity of moderate voltage (25-35 uV) seen predominantly in posterior head regions, symmetric and reactive to eye opening and eye closing. Sleep was characterized by vertex waves, sleep spindles (12 to 14 Hz), maximal frontocentral region. Hyperventilation and photic stimulation were not performed.    ? ?IMPRESSION: ?This study is within normal limits. No seizures or epileptiform discharges were seen throughout the recording. ? ?Lora Havens  ? ?

## 2021-05-15 NOTE — Progress Notes (Signed)
Out patient EEG completed, results pending  ?

## 2021-05-17 ENCOUNTER — Telehealth: Payer: Self-pay

## 2021-05-17 NOTE — Telephone Encounter (Signed)
-----   Message from Star Age, MD sent at 05/16/2021 10:09 AM EDT ----- ?Normal EEG, please notify pt. ?

## 2021-05-21 ENCOUNTER — Ambulatory Visit: Payer: Medicaid Other | Admitting: Neurology

## 2021-05-29 ENCOUNTER — Ambulatory Visit (INDEPENDENT_AMBULATORY_CARE_PROVIDER_SITE_OTHER): Payer: Medicaid Other | Admitting: Cardiology

## 2021-05-29 ENCOUNTER — Encounter: Payer: Self-pay | Admitting: Cardiology

## 2021-05-29 VITALS — BP 106/60 | HR 80 | Ht 60.0 in | Wt 132.2 lb

## 2021-05-29 DIAGNOSIS — I471 Supraventricular tachycardia, unspecified: Secondary | ICD-10-CM

## 2021-05-29 DIAGNOSIS — R55 Syncope and collapse: Secondary | ICD-10-CM

## 2021-05-29 DIAGNOSIS — Q2112 Patent foramen ovale: Secondary | ICD-10-CM | POA: Diagnosis not present
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
## 2021-05-29 NOTE — Patient Instructions (Addendum)
Medication Instructions:  ?Your physician recommends that you continue on your current medications as directed. Please refer to the Current Medication list given to you today.  ?*If you need a refill on your cardiac medications before your next appointment, please call your pharmacy* ? ? ?Lab Work: ?None ?If you have labs (blood work) drawn today and your tests are completely normal, you will receive your results only by: ?MyChart Message (if you have MyChart) OR ?A paper copy in the mail ?If you have any lab test that is abnormal or we need to change your treatment, we will call you to review the results. ? ? ?Testing/Procedures: ?None ? ? ?Follow-Up: ?At [facility], you and your health needs are our priority.  As part of our continuing mission to provide you with exceptional heart care, we have created designated Provider Care Teams.  These Care Teams include your primary Cardiologist (physician) and Advanced Practice Providers (APPs -  Physician Assistants and Nurse Practitioners) who all work together to provide you with the care you need, when you need it. ? ?We recommend signing up for the patient portal called "MyChart".  Sign up information is provided on this After Visit Summary.  MyChart is used to connect with patients for Virtual Visits (Telemedicine).  Patients are able to view lab/test results, encounter notes, upcoming appointments, etc.  Non-urgent messages can be sent to your provider as well.   ?To learn more about what you can do with MyChart, go to [email or web address].   ? ?Your next appointment:   ?6 month(s) ? ?The format for your next appointment:   ?Virtual Visit  ? ?Provider:   ?Thomasene Ripple, DO  ? ? ?Other Instructions ? ? ?Important Information About Sugar ? ? ? ? ?  ?

## 2021-06-18 ENCOUNTER — Ambulatory Visit: Payer: Medicaid Other | Admitting: Neurology

## 2021-06-18 ENCOUNTER — Ambulatory Visit (INDEPENDENT_AMBULATORY_CARE_PROVIDER_SITE_OTHER): Payer: Medicaid Other | Admitting: Cardiology

## 2021-06-18 ENCOUNTER — Encounter: Payer: Self-pay | Admitting: Cardiology

## 2021-06-18 VITALS — BP 104/72 | HR 72 | Ht 60.0 in | Wt 133.6 lb

## 2021-06-18 DIAGNOSIS — R55 Syncope and collapse: Secondary | ICD-10-CM | POA: Diagnosis not present

## 2021-06-18 NOTE — Patient Instructions (Signed)
Medication Instructions:  ?Your physician recommends that you continue on your current medications as directed. Please refer to the Current Medication list given to you today. ? ?*If you need a refill on your cardiac medications before your next appointment, please call your pharmacy* ? ? ?Lab Work: ?None ordered ? ? ?Testing/Procedures: ?None ordered ? ? ?Follow-Up: ?At [facility], you and your health needs are our priority.  As part of our continuing mission to provide you with exceptional heart care, we have created designated Provider Care Teams.  These Care Teams include your primary Cardiologist (physician) and Advanced Practice Providers (APPs -  Physician Assistants and Nurse Practitioners) who all work together to provide you with the care you need, when you need it. ? ? ?Your next appointment:   ?as  needed ? ?The format for your next appointment:   ?In Person ? ?Provider:   ?Allegra Lai, MD ? ? ? ?Thank you for choosing [facility]!! ? ? ?Trinidad Curet, RN ?[phone] ? ? ?Other Instructions ? ?Please call the office if you have further syncope (passing out) ?  ?
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
## 2021-07-02 ENCOUNTER — Ambulatory Visit (INDEPENDENT_AMBULATORY_CARE_PROVIDER_SITE_OTHER): Payer: Medicaid Other | Admitting: Orthopaedic Surgery

## 2021-07-02 DIAGNOSIS — M25511 Pain in right shoulder: Secondary | ICD-10-CM

## 2021-07-02 DIAGNOSIS — S43101A Unspecified dislocation of right acromioclavicular joint, initial encounter: Secondary | ICD-10-CM

## 2021-07-02 MED ORDER — LIDOCAINE HCL 1 % IJ SOLN
4.0000 mL | INTRAMUSCULAR | Status: AC | PRN
  Administered 2021-07-02: 4 mL

## 2021-07-02 MED ORDER — TRIAMCINOLONE ACETONIDE 40 MG/ML IJ SUSP
80.0000 mg | INTRAMUSCULAR | Status: AC | PRN
  Administered 2021-07-02: 80 mg via INTRA_ARTICULAR
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
## 2021-07-04 ENCOUNTER — Telehealth: Payer: Self-pay | Admitting: Neurology

## 2021-07-04 NOTE — Telephone Encounter (Signed)
LVM and sent mychart msg informing pt of r/s needed for 6/28- MD out. 

## 2021-07-11 ENCOUNTER — Ambulatory Visit (HOSPITAL_BASED_OUTPATIENT_CLINIC_OR_DEPARTMENT_OTHER): Payer: Medicaid Other | Admitting: Orthopaedic Surgery

## 2021-07-18 ENCOUNTER — Ambulatory Visit (INDEPENDENT_AMBULATORY_CARE_PROVIDER_SITE_OTHER): Payer: Medicaid Other | Admitting: Orthopaedic Surgery

## 2021-07-18 DIAGNOSIS — M7501 Adhesive capsulitis of right shoulder: Secondary | ICD-10-CM

## 2021-07-18 DIAGNOSIS — S43101A Unspecified dislocation of right acromioclavicular joint, initial encounter: Secondary | ICD-10-CM | POA: Diagnosis not present
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
## 2021-07-24 ENCOUNTER — Ambulatory Visit (INDEPENDENT_AMBULATORY_CARE_PROVIDER_SITE_OTHER): Payer: Medicaid Other

## 2021-07-24 DIAGNOSIS — R55 Syncope and collapse: Secondary | ICD-10-CM | POA: Diagnosis not present

## 2021-07-25 LAB — CUP PACEART REMOTE DEVICE CHECK
Date Time Interrogation Session: 20230606181906
Implantable Pulse Generator Implant Date: 20230501

## 2021-08-07 NOTE — Progress Notes (Signed)
Carelink Summary Report / Loop Recorder 

## 2021-08-15 ENCOUNTER — Ambulatory Visit: Payer: Medicaid Other | Admitting: Neurology

## 2021-08-27 ENCOUNTER — Telehealth: Payer: Self-pay | Admitting: Cardiology

## 2021-08-27 ENCOUNTER — Ambulatory Visit (INDEPENDENT_AMBULATORY_CARE_PROVIDER_SITE_OTHER): Payer: Medicaid Other

## 2021-08-27 DIAGNOSIS — R55 Syncope and collapse: Secondary | ICD-10-CM

## 2021-08-27 LAB — CUP PACEART REMOTE DEVICE CHECK
Date Time Interrogation Session: 20230709182108
Implantable Pulse Generator Implant Date: 20230501

## 2021-08-27 NOTE — Telephone Encounter (Signed)
  1. Has your device fired? No   2. Is you device beeping? No   3. Are you experiencing draining or swelling at device site? No  4. Are you calling to see if we received your device transmission? Yes  5. Have you passed out? No  Patient is wanting to confirm if there is anything she needs to do concerning home remote pacer checks. Reports you can message her on mychart regarding this.     Please route to Device Clinic Pool

## 2021-08-28 NOTE — Telephone Encounter (Signed)
I LMOVM to let patient know that there is nothing for her to do but sleep near her phone.

## 2021-09-27 NOTE — Progress Notes (Signed)
Carelink Summary Report / Loop Recorder 

## 2021-10-01 ENCOUNTER — Ambulatory Visit (INDEPENDENT_AMBULATORY_CARE_PROVIDER_SITE_OTHER): Payer: Medicaid Other

## 2021-10-01 DIAGNOSIS — R55 Syncope and collapse: Secondary | ICD-10-CM | POA: Diagnosis not present

## 2021-10-01 LAB — CUP PACEART REMOTE DEVICE CHECK
Date Time Interrogation Session: 20230811182028
Implantable Pulse Generator Implant Date: 20230501

## 2021-11-01 NOTE — Progress Notes (Signed)
Carelink Summary Report / Loop Recorder 

## 2021-11-03 LAB — CUP PACEART REMOTE DEVICE CHECK
Date Time Interrogation Session: 20230913182113
Implantable Pulse Generator Implant Date: 20230501

## 2021-11-05 ENCOUNTER — Ambulatory Visit (INDEPENDENT_AMBULATORY_CARE_PROVIDER_SITE_OTHER): Payer: Medicaid Other

## 2021-11-05 DIAGNOSIS — R55 Syncope and collapse: Secondary | ICD-10-CM

## 2021-11-16 NOTE — Progress Notes (Signed)
Carelink Summary Report / Loop Recorder 

## 2021-12-05 LAB — CUP PACEART REMOTE DEVICE CHECK
Date Time Interrogation Session: 20231016182020
Implantable Pulse Generator Implant Date: 20230501

## 2021-12-10 ENCOUNTER — Ambulatory Visit (INDEPENDENT_AMBULATORY_CARE_PROVIDER_SITE_OTHER): Payer: Medicaid Other

## 2021-12-10 DIAGNOSIS — R55 Syncope and collapse: Secondary | ICD-10-CM | POA: Diagnosis not present

## 2021-12-31 NOTE — Progress Notes (Signed)
Carelink Summary Report / Loop Recorder 

## 2022-01-14 ENCOUNTER — Ambulatory Visit (INDEPENDENT_AMBULATORY_CARE_PROVIDER_SITE_OTHER): Payer: Medicaid Other

## 2022-01-14 DIAGNOSIS — R55 Syncope and collapse: Secondary | ICD-10-CM | POA: Diagnosis not present

## 2022-01-14 LAB — CUP PACEART REMOTE DEVICE CHECK
Date Time Interrogation Session: 20231126231941
Implantable Pulse Generator Implant Date: 20230501

## 2022-02-19 ENCOUNTER — Ambulatory Visit (INDEPENDENT_AMBULATORY_CARE_PROVIDER_SITE_OTHER): Payer: Medicaid Other

## 2022-02-19 DIAGNOSIS — R55 Syncope and collapse: Secondary | ICD-10-CM | POA: Diagnosis not present

## 2022-02-19 LAB — CUP PACEART REMOTE DEVICE CHECK
Date Time Interrogation Session: 20240101231347
Implantable Pulse Generator Implant Date: 20230501

## 2022-02-22 NOTE — Progress Notes (Signed)
Carelink Summary Report / Loop Recorder 

## 2022-03-15 NOTE — Progress Notes (Signed)
Carelink Summary Report / Loop Recorder

## 2022-03-23 LAB — CUP PACEART REMOTE DEVICE CHECK
Date Time Interrogation Session: 20240203231552
Implantable Pulse Generator Implant Date: 20230501

## 2022-03-25 ENCOUNTER — Ambulatory Visit: Payer: Medicaid Other

## 2022-03-25 DIAGNOSIS — R55 Syncope and collapse: Secondary | ICD-10-CM

## 2022-04-29 ENCOUNTER — Ambulatory Visit: Payer: Medicaid Other

## 2022-04-29 DIAGNOSIS — R55 Syncope and collapse: Secondary | ICD-10-CM | POA: Diagnosis not present

## 2022-04-29 LAB — CUP PACEART REMOTE DEVICE CHECK
Date Time Interrogation Session: 20240310232100
Implantable Pulse Generator Implant Date: 20230501

## 2022-05-03 NOTE — Progress Notes (Signed)
Carelink Summary Report / Loop Recorder 

## 2022-05-07 ENCOUNTER — Encounter (HOSPITAL_BASED_OUTPATIENT_CLINIC_OR_DEPARTMENT_OTHER): Payer: Self-pay | Admitting: Orthopaedic Surgery

## 2022-05-07 NOTE — Telephone Encounter (Signed)
Seen. Waiting on Dr. Sammuel Hines response.

## 2022-06-03 ENCOUNTER — Ambulatory Visit (INDEPENDENT_AMBULATORY_CARE_PROVIDER_SITE_OTHER): Payer: Medicaid Other

## 2022-06-03 DIAGNOSIS — R55 Syncope and collapse: Secondary | ICD-10-CM

## 2022-06-03 LAB — CUP PACEART REMOTE DEVICE CHECK
Date Time Interrogation Session: 20240414231737
Implantable Pulse Generator Implant Date: 20230501

## 2022-06-04 NOTE — Progress Notes (Signed)
Carelink Summary Report / Loop Recorder 

## 2022-06-21 ENCOUNTER — Other Ambulatory Visit: Payer: Self-pay | Admitting: Obstetrics & Gynecology

## 2022-06-21 ENCOUNTER — Ambulatory Visit (INDEPENDENT_AMBULATORY_CARE_PROVIDER_SITE_OTHER): Payer: Medicaid Other | Admitting: *Deleted

## 2022-06-21 ENCOUNTER — Other Ambulatory Visit (INDEPENDENT_AMBULATORY_CARE_PROVIDER_SITE_OTHER): Payer: Medicaid Other

## 2022-06-21 ENCOUNTER — Other Ambulatory Visit: Payer: Self-pay | Admitting: *Deleted

## 2022-06-21 VITALS — BP 103/69 | HR 63 | Wt 143.0 lb

## 2022-06-21 DIAGNOSIS — Z3A01 Less than 8 weeks gestation of pregnancy: Secondary | ICD-10-CM | POA: Diagnosis not present

## 2022-06-21 DIAGNOSIS — O3680X0 Pregnancy with inconclusive fetal viability, not applicable or unspecified: Secondary | ICD-10-CM

## 2022-06-21 DIAGNOSIS — O0990 Supervision of high risk pregnancy, unspecified, unspecified trimester: Secondary | ICD-10-CM

## 2022-06-21 MED ORDER — PROGESTERONE 200 MG PO CAPS: ORAL_CAPSULE | ORAL | 3 refills | Status: DC

## 2022-06-21 MED ORDER — METOCLOPRAMIDE HCL 10 MG PO TABS: 10.0000 mg | ORAL_TABLET | Freq: Four times a day (QID) | ORAL | 2 refills | Status: DC | PRN

## 2022-06-21 MED ORDER — ENOXAPARIN SODIUM 40 MG/0.4ML IJ SOSY: PREFILLED_SYRINGE | INTRAMUSCULAR | 4 refills | Status: DC

## 2022-06-21 MED ORDER — ENOXAPARIN SODIUM 40 MG/0.4ML IJ SOSY: 40.0000 mg | PREFILLED_SYRINGE | INTRAMUSCULAR | 4 refills | Status: DC

## 2022-06-21 NOTE — Progress Notes (Signed)
New OB Intake  I explained I am completing New OB Intake today. LMP is approx 04/17/22. Pt is G6/P2. I reviewed her allergies, medications, Medical/Surgical/OB history, and appropriate screenings.   Patient Active Problem List   Diagnosis Date Noted   Supervision of high risk pregnancy, antepartum 06/21/2022   PFO (patent foramen ovale) 05/29/2021   PSVT (paroxysmal supraventricular tachycardia) 05/29/2021   Syncope and collapse 05/29/2021   Cerebral thrombosis with cerebral infarction 12/12/2020   Concussion 12/09/2020   AC separation, right, initial encounter     Concerns addressed today     Patient informed that the ultrasound is considered a limited obstetric ultrasound and is not intended to be a complete ultrasound exam.  Patient also informed that the ultrasound is not being completed with the intent of assessing for fetal or placental anomalies or any pelvic abnormalities. Explained that the purpose of today's ultrasound is to assess for dating and fetal heart rate.  Patient acknowledges the purpose of the exam and the limitations of the study.       Today's scan only showed GS and yolk sac. Will have pt come back in 2 weeks to repeat scan.  Also consulted with Dr Macon Large regarding pt taking a baby aspirin daily due to her history, pt to discontinue ASA and will be started on Lovenox daily. Also sent in prometrium due to history of SAB's.   Scheryl Marten, RN 06/21/2022  10:29 AM

## 2022-06-27 ENCOUNTER — Encounter: Payer: Medicaid Other | Admitting: Advanced Practice Midwife

## 2022-07-04 NOTE — Progress Notes (Signed)
Carelink Summary Report / Loop Recorder 

## 2022-07-05 ENCOUNTER — Ambulatory Visit (INDEPENDENT_AMBULATORY_CARE_PROVIDER_SITE_OTHER): Payer: Medicaid Other | Admitting: *Deleted

## 2022-07-05 ENCOUNTER — Other Ambulatory Visit (INDEPENDENT_AMBULATORY_CARE_PROVIDER_SITE_OTHER): Payer: Medicaid Other

## 2022-07-05 VITALS — BP 106/73 | HR 65

## 2022-07-05 DIAGNOSIS — Z3A01 Less than 8 weeks gestation of pregnancy: Secondary | ICD-10-CM

## 2022-07-05 DIAGNOSIS — Z3481 Encounter for supervision of other normal pregnancy, first trimester: Secondary | ICD-10-CM

## 2022-07-05 DIAGNOSIS — O3680X0 Pregnancy with inconclusive fetal viability, not applicable or unspecified: Secondary | ICD-10-CM

## 2022-07-05 MED ORDER — METRONIDAZOLE 500 MG PO TABS: 500.0000 mg | ORAL_TABLET | Freq: Two times a day (BID) | ORAL | 0 refills | Status: AC

## 2022-07-05 NOTE — Progress Notes (Addendum)
Pt here today for repeat scan to confirm an IUP.  Denies any cramping or bleeding.   Patient Active Problem List   Diagnosis Date Noted   Supervision of high risk pregnancy, antepartum 06/21/2022   PFO (patent foramen ovale) 05/29/2021   PSVT (paroxysmal supraventricular tachycardia) 05/29/2021   Syncope and collapse 05/29/2021   Cerebral thrombosis with cerebral infarction 12/12/2020   Concussion 12/09/2020   AC separation, right, initial encounter     Concerns addressed today   Patient informed that the ultrasound is considered a limited obstetric ultrasound and is not intended to be a complete ultrasound exam.  Patient also informed that the ultrasound is not being completed with the intent of assessing for fetal or placental anomalies or any pelvic abnormalities. Explained that the purpose of today's ultrasound is to assess for dating and fetal heart rate.  Patient acknowledges the purpose of the exam and the limitations of the study.       Single living IUP at 7weeks 6 days confirmed today.  Pt is unsure if she will continue the pregnancy due to her health issues, she will let us know and then make follow up appointment.   Scheryl Marten, RN 07/05/2022  8:51 AM

## 2022-07-08 ENCOUNTER — Ambulatory Visit (INDEPENDENT_AMBULATORY_CARE_PROVIDER_SITE_OTHER): Payer: Medicaid Other

## 2022-07-08 DIAGNOSIS — R55 Syncope and collapse: Secondary | ICD-10-CM | POA: Diagnosis not present

## 2022-07-08 LAB — CUP PACEART REMOTE DEVICE CHECK
Date Time Interrogation Session: 20240517230348
Implantable Pulse Generator Implant Date: 20230501

## 2022-08-02 NOTE — Progress Notes (Signed)
Carelink Summary Report / Loop Recorder 

## 2022-08-12 ENCOUNTER — Ambulatory Visit (INDEPENDENT_AMBULATORY_CARE_PROVIDER_SITE_OTHER): Payer: Medicaid Other

## 2022-08-12 DIAGNOSIS — R55 Syncope and collapse: Secondary | ICD-10-CM

## 2022-08-12 LAB — CUP PACEART REMOTE DEVICE CHECK
Date Time Interrogation Session: 20240623230806
Implantable Pulse Generator Implant Date: 20230501

## 2022-08-29 NOTE — Progress Notes (Signed)
Carelink Summary Report / Loop Recorder 

## 2022-09-16 ENCOUNTER — Ambulatory Visit: Payer: Medicaid Other

## 2022-09-16 DIAGNOSIS — R55 Syncope and collapse: Secondary | ICD-10-CM | POA: Diagnosis not present

## 2022-09-18 ENCOUNTER — Other Ambulatory Visit: Payer: Self-pay | Admitting: Obstetrics & Gynecology

## 2022-09-18 ENCOUNTER — Encounter: Payer: Self-pay | Admitting: Obstetrics & Gynecology

## 2022-09-19 NOTE — Telephone Encounter (Signed)
TC to pt to confirm if requested Rx was needed and if pt needs to start OB Care per Dr.A pt is high risk and will need to be seen if she is pregnant and needing Rx . No answer LVM.

## 2022-10-01 NOTE — Progress Notes (Signed)
Carelink Summary Report / Loop Recorder 

## 2022-10-03 ENCOUNTER — Telehealth: Payer: Self-pay | Admitting: *Deleted

## 2022-10-03 NOTE — Telephone Encounter (Signed)
Pre-operative Risk Assessment    Patient Name: Morgan Beltran  DOB: Apr 06, 1995 MRN: [id number]   DATE OF LAST OV: DR. Elberta Fortis 06/2021 AND DR. TOBB 05/2021 DATE OF FUTURE APPT: JUST REMOTE CHECKS ARE SCHEDULED AT THIS TIME  Request for Surgical Clearance    Procedure:   CHOLECYSTECTOMY   Date of Surgery:  Clearance TBD                                 Surgeon:  DR. Gaynelle Adu Surgeon's Group or Practice Name:  [facility] Phone number:  [phone] Fax number:  [phone] ATT: Campbell Stall, CMA   Type of Clearance Requested:   - Medical  - Pharmacy:  Hold    REQUEST STATES THE PT IS CURRENTLY TAKING LOVENOX   Type of Anesthesia:  General    Additional requests/questions:    Elpidio Anis   10/03/2022, 5:22 PM

## 2022-10-04 NOTE — Telephone Encounter (Signed)
Pt is scheduled with Azalee Course on 10/10/22. Will send notes to him.

## 2022-10-04 NOTE — Telephone Encounter (Signed)
    Primary Cardiologist:Kardie Tobb, DO  Chart reviewed as part of pre-operative protocol coverage. Because of Morgan Beltran's past medical history and time since last visit, he/she will require a follow-up visit in order to better assess preoperative cardiovascular risk.  Pre-op covering staff: - Please schedule Office appointment and call patient to inform them. - Please contact requesting surgeon's office via preferred method (i.e, phone, fax) to inform them of need for appointment prior to surgery.  Chart indicated patient taking enoxaparin because of CVA hx and pregnancy. Unable to determine if she is still pregnant or even taking enoxaparin. If she is, will need OB who prescribed enoxaparin to determine hold information   If applicable, this message will also be routed to pharmacy pool and/or primary cardiologist for input on holding anticoagulant/antiplatelet agent as requested below so that this information is available at time of patient's appointment.   Ronney Asters, NP  10/04/2022, 12:09 PM

## 2022-10-04 NOTE — Telephone Encounter (Signed)
Chart indicated patient taking enoxaparin because of CVA hx and pregnancy.  Unable to determine if she is still pregnant or even taking enoxaparin.  If she is, will need OB who prescribed enoxaparin to determine hold information

## 2022-10-10 ENCOUNTER — Ambulatory Visit: Payer: Medicaid Other | Attending: Physician Assistant | Admitting: Physician Assistant

## 2022-10-10 ENCOUNTER — Encounter: Payer: Self-pay | Admitting: Physician Assistant

## 2022-10-10 VITALS — BP 108/76 | HR 66 | Ht 60.0 in | Wt 146.4 lb

## 2022-10-10 DIAGNOSIS — Z01818 Encounter for other preprocedural examination: Secondary | ICD-10-CM | POA: Diagnosis not present

## 2022-10-10 NOTE — Patient Instructions (Signed)
Medication Instructions:  No changes  Except -- please do not take your Aspirin 81 mg  7 days prior to surgery - Cholecystectomy   *If you need a refill on your cardiac medications before your next appointment, please call your pharmacy*   Lab Work: Not needed    Testing/Procedures:  Not needed  Follow-Up: At [facility], you and your health needs are our priority.  As part of our continuing mission to provide you with exceptional heart care, we have created designated Provider Care Teams.  These Care Teams include your primary Cardiologist (physician) and Advanced Practice Providers (APPs -  Physician Assistants and Nurse Practitioners) who all work together to provide you with the care you need, when you need it.     Your next appointment:   12 month(s)  The format for your next appointment:   In Person  Provider:   Thomasene Ripple, DO    Other Instructions   [place] from a cardiac standpoint for upcoming surgery
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
## 2022-10-10 NOTE — Telephone Encounter (Signed)
Patient Name: Morgan Beltran  DOB: 02-19-1995 MRN: [id number]  Primary Cardiologist: Thomasene Ripple, DO  Chart reviewed as part of pre-operative protocol coverage. Given past medical history and time since last visit, based on ACC/AHA guidelines, [name] is at acceptable risk for the planned procedure without further cardiovascular testing. She has not taken Lovenox for several years.  She may hold aspirin for 7 days prior to the surgery and restart afterward.  The patient was advised that if she develops new symptoms prior to surgery to contact our office to arrange for a follow-up visit, and she verbalized understanding.  I will route this recommendation to the requesting party via Epic fax function and remove from pre-op pool.  Please call with questions.  [place], [place] 10/10/2022, 2:21 PM

## 2022-10-17 LAB — CUP PACEART REMOTE DEVICE CHECK
Date Time Interrogation Session: 20240828231254
Implantable Pulse Generator Implant Date: 20230501

## 2022-10-22 ENCOUNTER — Ambulatory Visit (INDEPENDENT_AMBULATORY_CARE_PROVIDER_SITE_OTHER): Payer: Medicaid Other

## 2022-10-22 DIAGNOSIS — R55 Syncope and collapse: Secondary | ICD-10-CM | POA: Diagnosis not present

## 2022-10-29 NOTE — Progress Notes (Signed)
Carelink Summary Report / Loop Recorder 

## 2022-11-14 ENCOUNTER — Inpatient Hospital Stay (HOSPITAL_COMMUNITY): Admission: RE | Admit: 2022-11-14 | Payer: Medicaid Other | Source: Ambulatory Visit

## 2022-11-18 ENCOUNTER — Encounter (HOSPITAL_COMMUNITY): Payer: Self-pay

## 2022-11-18 NOTE — Patient Instructions (Addendum)
SURGICAL WAITING ROOM VISITATION  Patients having surgery or a procedure may have no more than 2 support people in the waiting area - these visitors may rotate.    Children under the age of 54 must have an adult with them who is not the patient.  Due to an increase in RSV and influenza rates and associated hospitalizations, children ages 78 and under may not visit patients in [facility] hospitals.  If the patient needs to stay at the hospital during part of their recovery, the visitor guidelines for inpatient rooms apply. Pre-op nurse will coordinate an appropriate time for 1 support person to accompany patient in pre-op.  This support person may not rotate.    Please refer to the [facility] website for the visitor guidelines for Inpatients (after your surgery is over and you are in a regular room).    Your procedure is scheduled on: Monday, Oct. 7, 2024   Report to [facility] Main Entrance    Report to admitting at 12:45 PM   Call this number if you have problems the morning of surgery [phone]   Do not eat food :After Midnight.   After Midnight you may have the following liquids until 12:15 PM DAY OF SURGERY  Water Non-Citrus Juices (without pulp, NO RED-Apple, White grape, White cranberry) Black Coffee (NO MILK/CREAM OR CREAMERS, sugar ok)  Clear Tea (NO MILK/CREAM OR CREAMERS, sugar ok) regular and decaf                             Plain Jell-O (NO RED)                                           Fruit ices (not with fruit pulp, NO RED)                                     Popsicles (NO RED)                                                               Sports drinks like Gatorade (NO RED)          If you have questions, please contact your surgeon's office.   FOLLOW BOWEL PREP AND ANY ADDITIONAL PRE OP INSTRUCTIONS YOU RECEIVED FROM YOUR SURGEON'S OFFICE!!!     Oral Hygiene is also important to reduce your risk of infection.                                     Remember - BRUSH YOUR TEETH THE MORNING OF SURGERY WITH YOUR REGULAR TOOTHPASTE  DENTURES WILL BE REMOVED PRIOR TO SURGERY PLEASE DO NOT APPLY "Poly grip" OR ADHESIVES!!!   Stop all Aspirin, vitamins and herbal supplements 7 days before surgery.   Take these medicines the morning of surgery with A SIP OF WATER: None  You may not have any metal on your body including hair pins, jewelry, and body piercing             Do not wear make-up, lotions, powders, perfumes, or deodorant  Do not wear nail polish including gel and S&S, artificial/acrylic nails, or any other type of covering on natural nails including finger and toenails. If you have artificial nails, gel coating, etc. that needs to be removed by a nail salon please have this removed prior to surgery or surgery may need to be canceled/ delayed if the surgeon/ anesthesia feels like they are unable to be safely monitored.   Do not shave  48 hours prior to surgery.    Do not bring valuables to the hospital. [facility] IS NOT             RESPONSIBLE   FOR VALUABLES.   Contacts, glasses, dentures or bridgework may not be worn into surgery.  DO NOT BRING YOUR HOME MEDICATIONS TO THE HOSPITAL. PHARMACY WILL DISPENSE MEDICATIONS LISTED ON YOUR MEDICATION LIST TO YOU DURING YOUR ADMISSION IN THE HOSPITAL!    Patients discharged on the day of surgery will not be allowed to drive home.  Someone NEEDS to stay with you for the first 24 hours after anesthesia.              Please read over the following fact sheets you were given: IF YOU HAVE QUESTIONS ABOUT YOUR PRE-OP INSTRUCTIONS PLEASE CALL [phone] [place]ras   If you received a COVID test during your pre-op visit  it is requested that you wear a mask when out in public, stay away from anyone that may not be feeling well and notify your surgeon if you develop symptoms. If you test positive for Covid or have been in contact with anyone that has tested positive in  the last 10 days please notify you surgeon.    [place] - Preparing for Surgery Before surgery, you can play an important role.  Because skin is not sterile, your skin needs to be as free of germs as possible.  You can reduce the number of germs on your skin by washing with CHG (chlorahexidine gluconate) soap before surgery.  CHG is an antiseptic cleaner which kills germs and bonds with the skin to continue killing germs even after washing. Please DO NOT use if you have an allergy to CHG or antibacterial soaps.  If your skin becomes reddened/irritated stop using the CHG and inform your nurse when you arrive at Short Stay. Do not shave (including legs and underarms) for at least 48 hours prior to the first CHG shower.  You may shave your face/neck.  Please follow these instructions carefully:  1.  Shower with CHG Soap the night before surgery and the  morning of surgery.  2.  If you choose to wash your hair, wash your hair first as usual with your normal  shampoo.  3.  After you shampoo, rinse your hair and body thoroughly to remove the shampoo.                             4.  Use CHG as you would any other liquid soap.  You can apply chg directly to the skin and wash.  Gently with a scrungie or clean washcloth.  5.  Apply the CHG Soap to your body ONLY FROM THE NECK DOWN.   Do   not use on face/  open                           Wound or open sores. Avoid contact with eyes, ears mouth and   genitals (private parts).                       Wash face,  Genitals (private parts) with your normal soap.             6.  Wash thoroughly, paying special attention to the area where your    surgery  will be performed.  7.  Thoroughly rinse your body with warm water from the neck down.  8.  DO NOT shower/wash with your normal soap after using and rinsing off the CHG Soap.                9.  Pat yourself dry with a clean towel.            10.  Wear clean pajamas.            11.  Place clean sheets on your bed  the night of your first shower and do not  sleep with pets. Day of Surgery : Do not apply any lotions/deodorants the morning of surgery.  Please wear clean clothes to the hospital/surgery center.  FAILURE TO FOLLOW THESE INSTRUCTIONS MAY RESULT IN THE CANCELLATION OF YOUR SURGERY  PATIENT SIGNATURE_________________________________  NURSE SIGNATURE__________________________________  ________________________________________________________________________

## 2022-11-18 NOTE — Progress Notes (Signed)
Sent message, via epic in basket, requesting orders in epic from surgeon.  

## 2022-11-19 ENCOUNTER — Encounter (HOSPITAL_COMMUNITY): Payer: Self-pay

## 2022-11-19 ENCOUNTER — Other Ambulatory Visit: Payer: Self-pay

## 2022-11-19 ENCOUNTER — Encounter (HOSPITAL_COMMUNITY)
Admission: RE | Admit: 2022-11-19 | Discharge: 2022-11-19 | Disposition: A | Discharge status: Home / Self Care | Payer: Medicaid Other | Source: Ambulatory Visit | Attending: General Surgery | Admitting: General Surgery

## 2022-11-19 VITALS — BP 120/83 | HR 62 | Temp 97.7°F | Resp 16 | Ht 60.0 in | Wt 145.0 lb

## 2022-11-19 DIAGNOSIS — Z01812 Encounter for preprocedural laboratory examination: Secondary | ICD-10-CM | POA: Insufficient documentation

## 2022-11-19 DIAGNOSIS — J45909 Unspecified asthma, uncomplicated: Secondary | ICD-10-CM | POA: Diagnosis not present

## 2022-11-19 DIAGNOSIS — Z8673 Personal history of transient ischemic attack (TIA), and cerebral infarction without residual deficits: Secondary | ICD-10-CM | POA: Insufficient documentation

## 2022-11-19 DIAGNOSIS — Q2112 Patent foramen ovale: Secondary | ICD-10-CM | POA: Diagnosis not present

## 2022-11-19 DIAGNOSIS — Z87891 Personal history of nicotine dependence: Secondary | ICD-10-CM | POA: Diagnosis not present

## 2022-11-19 DIAGNOSIS — R55 Syncope and collapse: Secondary | ICD-10-CM | POA: Insufficient documentation

## 2022-11-19 DIAGNOSIS — I471 Supraventricular tachycardia, unspecified: Secondary | ICD-10-CM | POA: Diagnosis not present

## 2022-11-19 DIAGNOSIS — Z01818 Encounter for other preprocedural examination: Secondary | ICD-10-CM

## 2022-11-19 HISTORY — DX: Anemia, unspecified: D64.9

## 2022-11-19 LAB — BASIC METABOLIC PANEL
Anion gap: 5 (ref 5–15)
BUN: 15 mg/dL (ref 6–20)
CO2: 24 mmol/L (ref 22–32)
Calcium: 8.6 mg/dL — ABNORMAL LOW (ref 8.9–10.3)
Chloride: 108 mmol/L (ref 98–111)
Creatinine, Ser: 0.86 mg/dL (ref 0.44–1.00)
GFR, Estimated: >60 mL/min (ref >60)
Glucose, Bld: 86 mg/dL (ref 70–99)
Potassium: 4.2 mmol/L (ref 3.5–5.1)
Sodium: 137 mmol/L (ref 135–145)

## 2022-11-19 LAB — CBC
HCT: 39.5 % (ref 36.0–46.0)
Hemoglobin: 13 g/dL (ref 12.0–15.0)
MCH: 30.2 pg (ref 26.0–34.0)
MCHC: 32.9 g/dL (ref 30.0–36.0)
MCV: 91.6 fL (ref 80.0–100.0)
Platelets: 277 10*3/uL (ref 150–400)
RBC: 4.31 MIL/uL (ref 3.87–5.11)
RDW: 12.3 % (ref 11.5–15.5)
WBC: 6.9 10*3/uL (ref 4.0–10.5)
nRBC: 0 % (ref 0.0–0.2)

## 2022-11-19 LAB — CUP PACEART REMOTE DEVICE CHECK
Date Time Interrogation Session: 20240930231605
Implantable Pulse Generator Implant Date: 20230501

## 2022-11-19 NOTE — Progress Notes (Signed)
COVID Vaccine Completed: no  Date of COVID positive in last 90 days: no  PCP - Dr. Nathanial Rancher Cardiologist - Thomasene Ripple, DO Electrophysiologist- Loman Brooklyn, MD Nephrologist- Delia Heady ,MD  Cardiac clearance by Azalee Course, PA 10/10/22 in Epic   Chest x-ray - n/a EKG - 10/10/22 Epic Stress Test - n/a ECHO - 02/05/21 Epic Cardiac Cath - n/a Pacemaker/ICD device last checked: loop recorder last checked 10/17/22 Spinal Cord Stimulator: n/a  Bowel Prep - no  Sleep Study - n/a CPAP -   Fasting Blood Sugar - n/a Checks Blood Sugar _____ times a day  Last dose of GLP1 agonist-  N/A GLP1 instructions:  N/A   Last dose of SGLT-2 inhibitors-  N/A SGLT-2 instructions: N/A   Blood Thinner Instructions:  Time Aspirin Instructions: ASA 81, hold 7 days Last Dose:  Activity level: Can go up a flight of stairs and perform activities of daily living without stopping and without symptoms of chest pain or shortness of breath.  Anesthesia review: stroke, small PFO  Patient denies shortness of breath, fever, cough and chest pain at PAT appointment  Patient verbalized understanding of instructions that were given to them at the PAT appointment. Patient was also instructed that they will need to review over the PAT instructions again at home before surgery.
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
## 2022-11-25 ENCOUNTER — Encounter (HOSPITAL_COMMUNITY)
Admission: RE | Disposition: A | Discharge status: Home / Self Care | Payer: Self-pay | Source: Ambulatory Visit | Attending: General Surgery

## 2022-11-25 ENCOUNTER — Ambulatory Visit (HOSPITAL_BASED_OUTPATIENT_CLINIC_OR_DEPARTMENT_OTHER): Payer: Medicaid Other

## 2022-11-25 ENCOUNTER — Encounter (HOSPITAL_COMMUNITY): Payer: Self-pay | Admitting: General Surgery

## 2022-11-25 ENCOUNTER — Other Ambulatory Visit: Payer: Self-pay

## 2022-11-25 ENCOUNTER — Ambulatory Visit (HOSPITAL_COMMUNITY)
Admission: RE | Admit: 2022-11-25 | Discharge: 2022-11-25 | Disposition: A | Discharge status: Home / Self Care | Payer: Medicaid Other | Source: Ambulatory Visit | Attending: General Surgery | Admitting: General Surgery

## 2022-11-25 ENCOUNTER — Ambulatory Visit (INDEPENDENT_AMBULATORY_CARE_PROVIDER_SITE_OTHER): Payer: Medicaid Other

## 2022-11-25 ENCOUNTER — Ambulatory Visit (HOSPITAL_COMMUNITY): Payer: Medicaid Other

## 2022-11-25 DIAGNOSIS — K8010 Calculus of gallbladder with chronic cholecystitis without obstruction: Secondary | ICD-10-CM | POA: Insufficient documentation

## 2022-11-25 DIAGNOSIS — K8020 Calculus of gallbladder without cholecystitis without obstruction: Secondary | ICD-10-CM | POA: Diagnosis not present

## 2022-11-25 DIAGNOSIS — Z8673 Personal history of transient ischemic attack (TIA), and cerebral infarction without residual deficits: Secondary | ICD-10-CM | POA: Insufficient documentation

## 2022-11-25 DIAGNOSIS — R55 Syncope and collapse: Secondary | ICD-10-CM

## 2022-11-25 DIAGNOSIS — Z87891 Personal history of nicotine dependence: Secondary | ICD-10-CM | POA: Insufficient documentation

## 2022-11-25 DIAGNOSIS — J45909 Unspecified asthma, uncomplicated: Secondary | ICD-10-CM | POA: Insufficient documentation

## 2022-11-25 DIAGNOSIS — K429 Umbilical hernia without obstruction or gangrene: Secondary | ICD-10-CM | POA: Insufficient documentation

## 2022-11-25 DIAGNOSIS — Z01818 Encounter for other preprocedural examination: Secondary | ICD-10-CM

## 2022-11-25 HISTORY — PX: CHOLECYSTECTOMY: SHX55

## 2022-11-25 LAB — POCT PREGNANCY, URINE: Preg Test, Ur: NEGATIVE

## 2022-11-25 SURGERY — LAPAROSCOPIC CHOLECYSTECTOMY
Anesthesia: General

## 2022-11-25 MED ORDER — ACETAMINOPHEN 500 MG PO TABS
ORAL_TABLET | ORAL | Status: AC
  Administered 2022-11-25: 1000 mg via ORAL
  Filled 2022-11-25: qty 2

## 2022-11-25 MED ORDER — OXYCODONE HCL 5 MG/5ML PO SOLN: 5.0000 mg | Freq: Once | ORAL | Status: AC | PRN

## 2022-11-25 MED ORDER — ONDANSETRON HCL 4 MG/2ML IJ SOLN: 4.0000 mg | Freq: Once | INTRAMUSCULAR | Status: DC | PRN

## 2022-11-25 MED ORDER — ONDANSETRON HCL 4 MG/2ML IJ SOLN
INTRAMUSCULAR | Status: DC | PRN
  Administered 2022-11-25: 4 mg via INTRAVENOUS

## 2022-11-25 MED ORDER — LIDOCAINE HCL (PF) 2 % IJ SOLN
INTRAMUSCULAR | Status: AC
  Filled 2022-11-25: qty 5

## 2022-11-25 MED ORDER — FENTANYL CITRATE (PF) 100 MCG/2ML IJ SOLN
INTRAMUSCULAR | Status: DC | PRN
  Administered 2022-11-25 (×2): 25 ug via INTRAVENOUS
  Administered 2022-11-25: 100 ug via INTRAVENOUS
  Administered 2022-11-25: 50 ug via INTRAVENOUS

## 2022-11-25 MED ORDER — CHLORHEXIDINE GLUCONATE CLOTH 2 % EX PADS: 6.0000 | MEDICATED_PAD | Freq: Once | CUTANEOUS | Status: DC

## 2022-11-25 MED ORDER — ACETAMINOPHEN 500 MG PO TABS: 1000.0000 mg | ORAL_TABLET | Freq: Once | ORAL | Status: DC

## 2022-11-25 MED ORDER — ACETAMINOPHEN 500 MG PO TABS: 1000.0000 mg | ORAL_TABLET | ORAL | Status: AC

## 2022-11-25 MED ORDER — LACTATED RINGERS IV SOLN: INTRAVENOUS | Status: DC

## 2022-11-25 MED ORDER — ROCURONIUM BROMIDE 100 MG/10ML IV SOLN
INTRAVENOUS | Status: DC | PRN
  Administered 2022-11-25: 40 mg via INTRAVENOUS

## 2022-11-25 MED ORDER — SODIUM CHLORIDE 0.9 % IV SOLN
2.0000 g | INTRAVENOUS | Status: AC
  Administered 2022-11-25: 2 g via INTRAVENOUS

## 2022-11-25 MED ORDER — OXYCODONE HCL 5 MG PO TABS
ORAL_TABLET | ORAL | Status: AC
  Filled 2022-11-25: qty 1

## 2022-11-25 MED ORDER — ORAL CARE MOUTH RINSE: 15.0000 mL | Freq: Once | OROMUCOSAL | Status: AC

## 2022-11-25 MED ORDER — KETOROLAC TROMETHAMINE 30 MG/ML IJ SOLN
INTRAMUSCULAR | Status: AC
  Filled 2022-11-25: qty 1

## 2022-11-25 MED ORDER — SUGAMMADEX SODIUM 200 MG/2ML IV SOLN
INTRAVENOUS | Status: DC | PRN
Start: 2022-11-25 — End: 2022-11-25
  Administered 2022-11-25 (×2): 100 mg via INTRAVENOUS

## 2022-11-25 MED ORDER — FENTANYL CITRATE (PF) 100 MCG/2ML IJ SOLN
INTRAMUSCULAR | Status: AC
  Filled 2022-11-25: qty 2

## 2022-11-25 MED ORDER — KETOROLAC TROMETHAMINE 30 MG/ML IJ SOLN: 30.0000 mg | Freq: Once | INTRAMUSCULAR | Status: DC | PRN

## 2022-11-25 MED ORDER — SCOPOLAMINE 1 MG/3DAYS TD PT72
1.0000 | MEDICATED_PATCH | TRANSDERMAL | Status: DC
  Administered 2022-11-25: 1.5 mg via TRANSDERMAL
  Filled 2022-11-25: qty 1

## 2022-11-25 MED ORDER — MIDAZOLAM HCL 5 MG/5ML IJ SOLN
INTRAMUSCULAR | Status: DC | PRN
  Administered 2022-11-25: 2 mg via INTRAVENOUS

## 2022-11-25 MED ORDER — HYDROMORPHONE HCL 1 MG/ML IJ SOLN
0.2500 mg | INTRAMUSCULAR | Status: DC | PRN
  Administered 2022-11-25: 0.5 mg via INTRAVENOUS
  Administered 2022-11-25: 0.25 mg via INTRAVENOUS

## 2022-11-25 MED ORDER — DEXAMETHASONE SODIUM PHOSPHATE 10 MG/ML IJ SOLN
INTRAMUSCULAR | Status: DC | PRN
Start: 2022-11-25 — End: 2022-11-25
  Administered 2022-11-25: 10 mg via INTRAVENOUS

## 2022-11-25 MED ORDER — LIDOCAINE HCL (CARDIAC) PF 100 MG/5ML IV SOSY
PREFILLED_SYRINGE | INTRAVENOUS | Status: DC | PRN
  Administered 2022-11-25: 60 mg via INTRAVENOUS

## 2022-11-25 MED ORDER — SODIUM CHLORIDE 0.9 % IV SOLN
INTRAVENOUS | Status: AC
  Filled 2022-11-25: qty 2

## 2022-11-25 MED ORDER — PROPOFOL 10 MG/ML IV BOLUS
INTRAVENOUS | Status: AC
  Filled 2022-11-25: qty 20

## 2022-11-25 MED ORDER — AMISULPRIDE (ANTIEMETIC) 5 MG/2ML IV SOLN: 10.0000 mg | Freq: Once | INTRAVENOUS | Status: DC | PRN

## 2022-11-25 MED ORDER — MIDAZOLAM HCL 2 MG/2ML IJ SOLN
INTRAMUSCULAR | Status: AC
  Filled 2022-11-25: qty 2

## 2022-11-25 MED ORDER — ROCURONIUM BROMIDE 10 MG/ML (PF) SYRINGE
PREFILLED_SYRINGE | INTRAVENOUS | Status: AC
  Filled 2022-11-25: qty 10

## 2022-11-25 MED ORDER — KETOROLAC TROMETHAMINE 30 MG/ML IJ SOLN
INTRAMUSCULAR | Status: DC | PRN
Start: 2022-11-25 — End: 2022-11-25
  Administered 2022-11-25: 30 mg via INTRAVENOUS

## 2022-11-25 MED ORDER — ONDANSETRON HCL 4 MG/2ML IJ SOLN
INTRAMUSCULAR | Status: AC
  Filled 2022-11-25: qty 2

## 2022-11-25 MED ORDER — DEXAMETHASONE SODIUM PHOSPHATE 10 MG/ML IJ SOLN
INTRAMUSCULAR | Status: AC
  Filled 2022-11-25: qty 1

## 2022-11-25 MED ORDER — MEPERIDINE HCL 50 MG/ML IJ SOLN: 6.2500 mg | INTRAMUSCULAR | Status: DC | PRN

## 2022-11-25 MED ORDER — LACTATED RINGERS IV SOLN
INTRAVENOUS | Status: DC | PRN
  Administered 2022-11-25: 1

## 2022-11-25 MED ORDER — OXYCODONE HCL 5 MG PO TABS
5.0000 mg | ORAL_TABLET | Freq: Once | ORAL | Status: AC | PRN
  Administered 2022-11-25: 5 mg via ORAL

## 2022-11-25 MED ORDER — OXYCODONE HCL 5 MG PO TABS: 5.0000 mg | ORAL_TABLET | Freq: Four times a day (QID) | ORAL | 0 refills | Status: DC | PRN

## 2022-11-25 MED ORDER — CHLORHEXIDINE GLUCONATE 0.12 % MT SOLN
15.0000 mL | Freq: Once | OROMUCOSAL | Status: AC
  Administered 2022-11-25: 15 mL via OROMUCOSAL

## 2022-11-25 MED ORDER — SPY AGENT GREEN - (INDOCYANINE FOR INJECTION)
1.2500 mg | Freq: Once | INTRAMUSCULAR | Status: AC
  Administered 2022-11-25: 1.25 mg via INTRAVENOUS
  Filled 2022-11-25: qty 10

## 2022-11-25 MED ORDER — HYDROMORPHONE HCL 1 MG/ML IJ SOLN
INTRAMUSCULAR | Status: AC
  Filled 2022-11-25: qty 1

## 2022-11-25 MED ORDER — PROPOFOL 10 MG/ML IV BOLUS
INTRAVENOUS | Status: DC | PRN
  Administered 2022-11-25: 50 mg via INTRAVENOUS
  Administered 2022-11-25: 150 mg via INTRAVENOUS

## 2022-11-25 MED ORDER — BUPIVACAINE-EPINEPHRINE 0.25% -1:200000 IJ SOLN
INTRAMUSCULAR | Status: DC | PRN
  Administered 2022-11-25: 20 mL

## 2022-11-25 SURGICAL SUPPLY — 56 items
ADH SKN CLS APL DERMABOND .7 (GAUZE/BANDAGES/DRESSINGS) ×1
APL PRP STRL LF DISP 70% ISPRP (MISCELLANEOUS) ×1
APL SRG 38 LTWT LNG FL B (MISCELLANEOUS)
APPLICATOR ARISTA FLEXITIP XL (MISCELLANEOUS) IMPLANT
APPLIER CLIP 5 13 M/L LIGAMAX5 (MISCELLANEOUS)
APPLIER CLIP ROT 10 11.4 M/L (STAPLE)
APR CLP MED LRG 11.4X10 (STAPLE)
APR CLP MED LRG 5 ANG JAW (MISCELLANEOUS)
BAG COUNTER SPONGE SURGICOUNT (BAG) IMPLANT
BAG SPEC RTRVL 10 TROC 200 (ENDOMECHANICALS) ×1
BAG SPNG CNTER NS LX DISP (BAG)
CABLE HIGH FREQUENCY MONO STRZ (ELECTRODE) ×1 IMPLANT
CHLORAPREP W/TINT 26 (MISCELLANEOUS) ×1 IMPLANT
CLIP APPLIE 5 13 M/L LIGAMAX5 (MISCELLANEOUS) IMPLANT
CLIP APPLIE ROT 10 11.4 M/L (STAPLE) IMPLANT
CLIP LIGATING HEMO O LOK GREEN (MISCELLANEOUS) IMPLANT
COVER MAYO STAND XLG (MISCELLANEOUS) IMPLANT
COVER SURGICAL LIGHT HANDLE (MISCELLANEOUS) ×1 IMPLANT
DERMABOND ADVANCED .7 DNX12 (GAUZE/BANDAGES/DRESSINGS) IMPLANT
DRAPE C-ARM 42X120 X-RAY (DRAPES) IMPLANT
DRSG TEGADERM 2-3/8X2-3/4 SM (GAUZE/BANDAGES/DRESSINGS) ×3 IMPLANT
DRSG TEGADERM 4X4.75 (GAUZE/BANDAGES/DRESSINGS) ×1 IMPLANT
ELECT REM PT RETURN 15FT ADLT (MISCELLANEOUS) ×1 IMPLANT
GAUZE SPONGE 2X2 8PLY STRL LF (GAUZE/BANDAGES/DRESSINGS) ×1 IMPLANT
GLOVE BIO SURGEON STRL SZ7.5 (GLOVE) ×1 IMPLANT
GLOVE INDICATOR 8.0 STRL GRN (GLOVE) ×1 IMPLANT
GOWN STRL REUS W/ TWL XL LVL3 (GOWN DISPOSABLE) ×1 IMPLANT
GOWN STRL REUS W/TWL XL LVL3 (GOWN DISPOSABLE) ×1
GRASPER SUT TROCAR 14GX15 (MISCELLANEOUS) IMPLANT
HEMOSTAT ARISTA ABSORB 3G PWDR (HEMOSTASIS) IMPLANT
HEMOSTAT SNOW SURGICEL 2X4 (HEMOSTASIS) IMPLANT
IRRIG SUCT STRYKERFLOW 2 WTIP (MISCELLANEOUS) ×1
IRRIGATION SUCT STRKRFLW 2 WTP (MISCELLANEOUS) ×1 IMPLANT
KIT BASIN OR (CUSTOM PROCEDURE TRAY) ×1 IMPLANT
KIT TURNOVER KIT A (KITS) IMPLANT
L-HOOK LAP DISP 36CM (ELECTROSURGICAL)
LHOOK LAP DISP 36CM (ELECTROSURGICAL) IMPLANT
POUCH RETRIEVAL ECOSAC 10 (ENDOMECHANICALS) ×1 IMPLANT
SCISSORS LAP 5X35 DISP (ENDOMECHANICALS) ×1 IMPLANT
SET CHOLANGIOGRAPH MIX (MISCELLANEOUS) IMPLANT
SET TUBE SMOKE EVAC HIGH FLOW (TUBING) ×1 IMPLANT
SLEEVE ADV FIXATION 5X100MM (TROCAR) ×1 IMPLANT
SPIKE FLUID TRANSFER (MISCELLANEOUS) ×1 IMPLANT
STRIP CLOSURE SKIN 1/2X4 (GAUZE/BANDAGES/DRESSINGS) ×1 IMPLANT
SUT MNCRL AB 4-0 PS2 18 (SUTURE) ×1 IMPLANT
SUT VIC AB 0 UR5 27 (SUTURE) IMPLANT
SUT VIC AB 3-0 SH 18 (SUTURE) IMPLANT
SUT VICRYL 0 TIES 12 18 (SUTURE) IMPLANT
SUT VICRYL 0 UR6 27IN ABS (SUTURE) IMPLANT
TOWEL OR 17X26 10 PK STRL BLUE (TOWEL DISPOSABLE) ×1 IMPLANT
TOWEL OR NON WOVEN STRL DISP B (DISPOSABLE) ×1 IMPLANT
TRAY LAPAROSCOPIC (CUSTOM PROCEDURE TRAY) ×1 IMPLANT
TROCAR ADV FIXATION 12X100MM (TROCAR) IMPLANT
TROCAR ADV FIXATION 5X100MM (TROCAR) ×1 IMPLANT
TROCAR BALLN 12MMX100 BLUNT (TROCAR) IMPLANT
TROCAR XCEL NON-BLD 5MMX100MML (ENDOMECHANICALS) IMPLANT
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
## 2022-11-25 NOTE — Transfer of Care (Signed)
Immediate Anesthesia Transfer of Care Note  Patient: Morgan Beltran  Procedure(s) Performed: LAPAROSCOPIC CHOLECYSTECTOMY WITH ICG DYE  Patient Location: PACU  Anesthesia Type:General  Level of Consciousness: drowsy and patient cooperative  Airway & Oxygen Therapy: Patient Spontanous Breathing and Patient connected to nasal cannula oxygen  Post-op Assessment: Report given to RN and Post -op Vital signs reviewed and stable  Post vital signs: Reviewed and stable  Last Vitals:  Vitals Value Taken Time  BP 127/86 11/25/22 1618  Temp    Pulse 66 11/25/22 1620  Resp 14 11/25/22 1620  SpO2 99 % 11/25/22 1620  Vitals shown include unfiled device data.  Last Pain:  Vitals:   11/25/22 1327  TempSrc: Oral  PainSc:          Complications: No notable events documented.

## 2022-11-25 NOTE — Discharge Instructions (Signed)

## 2022-11-25 NOTE — Op Note (Signed)
Morgan Beltran [id number]   Laparoscopic Cholecystectomy with near infrared fluorescent cholangiography procedure Note   Indications: This patient presents with symptomatic gallbladder disease and will undergo laparoscopic cholecystectomy.  Pre-operative Diagnosis: symptomatic cholelithiasis   Post-operative Diagnosis: Same   Surgeon: Gaynelle Adu MD FACS   Assistants: none   Anesthesia: General endotracheal anesthesia     Procedure Details  The patient was seen again in the Holding Room. The risks, benefits, complications, treatment options, and expected outcomes were discussed with the patient. The possibilities of reaction to medication, pulmonary aspiration, perforation of viscus, bleeding, recurrent infection, finding a normal gallbladder, the need for additional procedures, failure to diagnose a condition, the possible need to convert to an open procedure, and creating a complication requiring transfusion or operation were discussed with the patient. The likelihood of improving the patient's symptoms with return to their baseline status is good.  The patient and/or family concurred with the proposed plan, giving informed consent. The site of surgery properly noted. The patient was taken to Operating Room, identified as Morgan Beltran and the procedure verified as Laparoscopic Cholecystectomy with ICG dye.  A Time Out was held and the above information confirmed. Antibiotic prophylaxis was administered.    ICG dye was administered preoperatively.     General endotracheal anesthesia was then administered and tolerated well. After the induction, the abdomen was prepped with Chloraprep and draped in the sterile fashion. The patient was positioned in the supine position.   Local anesthetic agent was injected into the skin near the umbilicus and an incision made. We dissected down to the abdominal fascia with blunt dissection. She had a pre-existing small fascial defect at the umbilicus.   The fascia was further incised vertically and we entered the peritoneal cavity bluntly.  A pursestring suture of 0-Vicryl was placed around the fascial opening.  The Hasson cannula was inserted and secured with the stay suture.  Pneumoperitoneum was then created with CO2 and tolerated well without any adverse changes in the patient's vital signs. An 5-mm port was placed in the subxiphoid position.  Two 5-mm ports were placed in the right upper quadrant. All skin incisions were infiltrated with a local anesthetic agent before making the incision and placing the trocars.    We positioned the patient in reverse Trendelenburg, tilted slightly to the patient's left.  The gallbladder was identified, the fundus grasped and retracted cephalad. Adhesions were lysed bluntly and with the electrocautery where indicated, taking care not to injure any adjacent organs or viscus. The infundibulum was grasped and retracted laterally, exposing the peritoneum overlying the triangle of Calot. This was then divided and exposed in a blunt fashion. A critical view of the cystic duct and cystic artery was obtained.  The cystic duct was clearly identified and bluntly dissected circumferentially.  Utilizing the Stryker camera system near infrared fluorescent activity was visualized in the liver, cystic duct, common hepatic duct and common bile duct and small bowel.  This served as a secondary confirmation of our anatomy.   The cystic duct was then ligated with clips and divided. The cystic artery which had been identified & dissected free was ligated with clips and divided as well.    The gallbladder was dissected from the liver bed in retrograde fashion with the electrocautery. The gallbladder was removed and placed in an Ecco sac.  The gallbladder and Ecco sac were then removed through the umbilical port site. The liver bed was irrigated and inspected. Hemostasis was achieved with the  electrocautery. Copious irrigation was utilized  and was repeatedly aspirated until clear.  The pursestring suture was used to close the umbilical fascia.  Given she had a small pre-existing fascial defect at the umbilicus so I placed 4 additional interrupted 0 vicryl sutures using PMI suture passer at the umbilicus.     We again inspected the right upper quadrant for hemostasis.  The umbilical closure was inspected and there was no air leak and nothing trapped within the closure. Pneumoperitoneum was released as we removed the trocars.  4-0 Monocryl was used to close the skin.  Dermabond was applied. The patient was then extubated and brought to the recovery room in stable condition. Instrument, sponge, and needle counts were correct at closure and at the conclusion of the case.    Findings: +critical view   Estimated Blood Loss: Minimal         Drains: none         Specimens: Gallbladder           Complications: None; patient tolerated the procedure well.         Disposition: PACU - hemodynamically stable.         Condition: stable   Mary Sella. Andrey Campanile, MD, FACS General, Bariatric, & Minimally Invasive Surgery [facility],  [facility]

## 2022-11-25 NOTE — Anesthesia Procedure Notes (Signed)
Procedure Name: Intubation Date/Time: 11/25/2022 3:00 PM  Performed by: Maurene Capes, CRNAPre-anesthesia Checklist: Patient identified, Emergency Drugs available, Suction available and Patient being monitored Patient Re-evaluated:Patient Re-evaluated prior to induction Oxygen Delivery Method: Circle System Utilized Preoxygenation: Pre-oxygenation with 100% oxygen Induction Type: IV induction Ventilation: Mask ventilation without difficulty Laryngoscope Size: Mac and 3 Grade View: Grade II Tube type: Oral Number of attempts: 1 Placement Confirmation: ETT inserted through vocal cords under direct vision, positive ETCO2 and breath sounds checked- equal and bilateral Secured at: 20 cm Tube secured with: Tape Dental Injury: Teeth and Oropharynx as per pre-operative assessment

## 2022-11-25 NOTE — Anesthesia Postprocedure Evaluation (Signed)
Anesthesia Post Note  Patient: Morgan Beltran  Procedure(s) Performed: LAPAROSCOPIC CHOLECYSTECTOMY WITH ICG DYE     Patient location during evaluation: PACU Anesthesia Type: General Level of consciousness: awake and alert Pain management: pain level controlled Vital Signs Assessment: post-procedure vital signs reviewed and stable Respiratory status: spontaneous breathing, nonlabored ventilation, respiratory function stable and patient connected to nasal cannula oxygen Cardiovascular status: blood pressure returned to baseline and stable Postop Assessment: no apparent nausea or vomiting Anesthetic complications: no  No notable events documented.  Last Vitals:  Vitals:   11/25/22 1745 11/25/22 1800  BP: 115/77 112/78  Pulse: 71 67  Resp: (!) 21 19  Temp:    SpO2: 94% 95%    Last Pain:  Vitals:   11/25/22 1745  TempSrc:   PainSc: Asleep                 Sheriff Rodenberg L Aneya Daddona
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
## 2022-11-25 NOTE — Progress Notes (Signed)
Urine pregnancy negative in preop.

## 2022-11-26 ENCOUNTER — Encounter (HOSPITAL_COMMUNITY): Payer: Self-pay | Admitting: General Surgery

## 2022-11-27 LAB — SURGICAL PATHOLOGY

## 2022-12-06 NOTE — Progress Notes (Signed)
Carelink Summary Report / Loop Recorder 

## 2022-12-23 LAB — CUP PACEART REMOTE DEVICE CHECK
Date Time Interrogation Session: 20241102230428
Implantable Pulse Generator Implant Date: 20230501

## 2022-12-30 ENCOUNTER — Ambulatory Visit (INDEPENDENT_AMBULATORY_CARE_PROVIDER_SITE_OTHER): Payer: Medicaid Other

## 2022-12-30 DIAGNOSIS — R55 Syncope and collapse: Secondary | ICD-10-CM | POA: Diagnosis not present

## 2023-01-22 NOTE — Progress Notes (Signed)
Carelink Summary Report / Loop Recorder 

## 2023-01-24 ENCOUNTER — Ambulatory Visit (INDEPENDENT_AMBULATORY_CARE_PROVIDER_SITE_OTHER): Payer: Self-pay

## 2023-01-24 DIAGNOSIS — R55 Syncope and collapse: Secondary | ICD-10-CM

## 2023-01-24 LAB — CUP PACEART REMOTE DEVICE CHECK
Date Time Interrogation Session: 20241205230815
Implantable Pulse Generator Implant Date: 20230501

## 2023-02-03 ENCOUNTER — Ambulatory Visit: Payer: Medicaid Other

## 2023-02-28 ENCOUNTER — Ambulatory Visit: Payer: Medicaid Other

## 2023-02-28 DIAGNOSIS — R55 Syncope and collapse: Secondary | ICD-10-CM | POA: Diagnosis not present

## 2023-02-28 LAB — CUP PACEART REMOTE DEVICE CHECK
Date Time Interrogation Session: 20250109231438
Implantable Pulse Generator Implant Date: 20230501

## 2023-04-04 ENCOUNTER — Ambulatory Visit: Payer: Medicaid Other

## 2023-04-04 DIAGNOSIS — R55 Syncope and collapse: Secondary | ICD-10-CM | POA: Diagnosis not present

## 2023-04-04 LAB — CUP PACEART REMOTE DEVICE CHECK
Date Time Interrogation Session: 20250213230510
Implantable Pulse Generator Implant Date: 20230501

## 2023-04-04 NOTE — Progress Notes (Signed)
Carelink Summary Report / Loop Recorder

## 2023-05-02 NOTE — Progress Notes (Signed)
 [facility] Summary Report / Loop Recorder

## 2023-05-09 ENCOUNTER — Ambulatory Visit: Payer: Medicaid Other

## 2023-05-09 DIAGNOSIS — R55 Syncope and collapse: Secondary | ICD-10-CM | POA: Diagnosis not present

## 2023-05-09 LAB — CUP PACEART REMOTE DEVICE CHECK
Date Time Interrogation Session: 20250320230830
Implantable Pulse Generator Implant Date: 20230501

## 2023-06-13 ENCOUNTER — Ambulatory Visit (INDEPENDENT_AMBULATORY_CARE_PROVIDER_SITE_OTHER): Payer: Medicaid Other

## 2023-06-13 DIAGNOSIS — R55 Syncope and collapse: Secondary | ICD-10-CM | POA: Diagnosis not present

## 2023-06-13 LAB — CUP PACEART REMOTE DEVICE CHECK
Date Time Interrogation Session: 20250424230841
Implantable Pulse Generator Implant Date: 20230501

## 2023-06-17 NOTE — Progress Notes (Signed)
 [facility] Summary Report / Loop Recorder

## 2023-07-18 ENCOUNTER — Ambulatory Visit (INDEPENDENT_AMBULATORY_CARE_PROVIDER_SITE_OTHER): Payer: Medicaid Other

## 2023-07-18 DIAGNOSIS — R55 Syncope and collapse: Secondary | ICD-10-CM

## 2023-07-18 LAB — CUP PACEART REMOTE DEVICE CHECK
Date Time Interrogation Session: 20250529231813
Implantable Pulse Generator Implant Date: 20230501

## 2023-07-20 ENCOUNTER — Ambulatory Visit: Payer: Self-pay | Admitting: Cardiology

## 2023-07-21 NOTE — Progress Notes (Signed)
 [facility] Summary Report / Loop Recorder

## 2023-08-18 ENCOUNTER — Ambulatory Visit (INDEPENDENT_AMBULATORY_CARE_PROVIDER_SITE_OTHER)

## 2023-08-18 ENCOUNTER — Ambulatory Visit: Payer: Self-pay | Admitting: Cardiology

## 2023-08-18 DIAGNOSIS — R55 Syncope and collapse: Secondary | ICD-10-CM

## 2023-08-18 LAB — CUP PACEART REMOTE DEVICE CHECK
Date Time Interrogation Session: 20250629232804
Implantable Pulse Generator Implant Date: 20230501

## 2023-08-25 ENCOUNTER — Ambulatory Visit: Payer: Medicaid Other

## 2023-09-02 NOTE — Progress Notes (Signed)
 [facility] Summary Report / Loop Recorder

## 2023-09-17 NOTE — Progress Notes (Signed)
 [facility] Summary Report / Loop Recorder

## 2023-09-18 ENCOUNTER — Ambulatory Visit

## 2023-09-18 DIAGNOSIS — R55 Syncope and collapse: Secondary | ICD-10-CM

## 2023-09-18 LAB — CUP PACEART REMOTE DEVICE CHECK
Date Time Interrogation Session: 20250730232322
Implantable Pulse Generator Implant Date: 20230501

## 2023-09-19 ENCOUNTER — Ambulatory Visit: Payer: Self-pay | Admitting: Cardiology

## 2023-09-22 ENCOUNTER — Encounter: Payer: Self-pay | Admitting: Neurology

## 2023-09-22 ENCOUNTER — Telehealth: Payer: Self-pay | Admitting: Neurology

## 2023-09-22 ENCOUNTER — Ambulatory Visit: Admitting: Neurology

## 2023-09-22 VITALS — BP 100/63 | HR 61 | Ht 60.0 in | Wt 134.2 lb

## 2023-09-22 DIAGNOSIS — R519 Headache, unspecified: Secondary | ICD-10-CM

## 2023-09-22 DIAGNOSIS — Z8673 Personal history of transient ischemic attack (TIA), and cerebral infarction without residual deficits: Secondary | ICD-10-CM

## 2023-09-22 DIAGNOSIS — M5440 Lumbago with sciatica, unspecified side: Secondary | ICD-10-CM | POA: Diagnosis not present

## 2023-09-22 DIAGNOSIS — R269 Unspecified abnormalities of gait and mobility: Secondary | ICD-10-CM | POA: Diagnosis not present

## 2023-09-22 MED ORDER — ASPIRIN 81 MG PO TBEC: 81.0000 mg | DELAYED_RELEASE_TABLET | Freq: Every day | ORAL | 12 refills | Status: DC

## 2023-09-22 NOTE — Telephone Encounter (Signed)
 Pt stopped by checkout and asked me to send a message to see if she could get the cervical spine MRI as well as the lumbar spine because her neck is bothering her more than her back.

## 2023-09-22 NOTE — Patient Instructions (Signed)
 I had a long discussion with the patient and her husband regarding her new complaints of intermittent transient temporal sharp pains discussed treatment options including trial of indomethacin but the patient wants to hold off at the present time.  Will check MRI scan of the brain to rule out any underlying structural lesions.  She is also having acute back pain and difficulty walking.  Recommend checking MRI of the lumbar spine to look for any herniated disc.  Check lipid profile and hemoglobin A1c.  I recommend patient start taking aspirin  81 mg daily for secondary stroke prevention and maintain aggressive risk factor modification with strict control of hypertension with blood pressure goal below 140/90, lipids with LDL cholesterol goal below 70 mg percent and diabetes with hemoglobin A1c goal below 6.5%.  She will return for follow-up in the future in 6 to 8 months or call earlier if necessary.
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
## 2023-09-23 LAB — LIPID PANEL
Chol/HDL Ratio: 3.2 ratio (ref 0.0–4.4)
Cholesterol, Total: 151 mg/dL (ref 100–199)
HDL: 47 mg/dL (ref >39)
LDL Chol Calc (NIH): 87 mg/dL (ref 0–99)
Triglycerides: 92 mg/dL (ref 0–149)
VLDL Cholesterol Cal: 17 mg/dL (ref 5–40)

## 2023-09-23 LAB — HEMOGLOBIN A1C
Est. average glucose Bld gHb Est-mCnc: 103 mg/dL
Hgb A1c MFr Bld: 5.2 % (ref 4.8–5.6)

## 2023-09-24 NOTE — Telephone Encounter (Signed)
 For the brain MRI the insurance wants the notes to say: Details of how the headaches are changing (increasing in frequency or changing in character). If you edit them I will resend.  For the lumbar spine they are wanting: Missing Clinical: Dates and duration of treatment (physical therapy, chiropractic, osteopathic manipulative treatment or home exercise program) showing 6 weeks worth within the past 6 months. Or most recent nerve test (EMG/NCS) showing a problem (radiculopathy).

## 2023-09-25 ENCOUNTER — Ambulatory Visit (INDEPENDENT_AMBULATORY_CARE_PROVIDER_SITE_OTHER): Payer: Self-pay | Admitting: Neurology

## 2023-09-25 DIAGNOSIS — Z8673 Personal history of transient ischemic attack (TIA), and cerebral infarction without residual deficits: Secondary | ICD-10-CM | POA: Diagnosis not present

## 2023-09-25 DIAGNOSIS — R519 Headache, unspecified: Secondary | ICD-10-CM

## 2023-09-25 NOTE — Progress Notes (Signed)
 Kindly inform the patient that cholesterol profile is mostly satisfactory except bad cholesterol is slightly high.  She can start with low-fat diet and healthy eating and have this rechecked in 3 months and if it does not come down she may need cholesterol medications in the future.

## 2023-09-26 ENCOUNTER — Ambulatory Visit: Payer: Medicaid Other

## 2023-10-08 MED ORDER — INDOMETHACIN 25 MG PO CAPS: 25.0000 mg | ORAL_CAPSULE | Freq: Two times a day (BID) | ORAL | 1 refills | Status: DC | PRN
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
## 2023-10-20 ENCOUNTER — Ambulatory Visit

## 2023-10-20 DIAGNOSIS — R55 Syncope and collapse: Secondary | ICD-10-CM | POA: Diagnosis not present

## 2023-10-22 LAB — CUP PACEART REMOTE DEVICE CHECK
Date Time Interrogation Session: 20250830231207
Implantable Pulse Generator Implant Date: 20230501

## 2023-10-23 ENCOUNTER — Ambulatory Visit: Payer: Self-pay | Admitting: Cardiology

## 2023-10-26 ENCOUNTER — Other Ambulatory Visit: Payer: Self-pay | Admitting: Medical Genetics

## 2023-10-27 NOTE — Telephone Encounter (Signed)
 I submitted these to her insurance on 8/5 and since we didn't have the additional information, they took a long time to look over the notes and they did approve the MRIs but not until 8/29 and only until 9/4 which left no time to get her scheduled. They don't do extensions, so I have to cancel and resubmit the prior authorizations again but the additional information they asked for before would help get it through quicker.

## 2023-10-27 NOTE — Progress Notes (Signed)
 Remote Loop Recorder Transmission

## 2023-10-29 ENCOUNTER — Ambulatory Visit (HOSPITAL_COMMUNITY)

## 2023-10-29 ENCOUNTER — Ambulatory Visit (HOSPITAL_COMMUNITY)
Admission: RE | Admit: 2023-10-29 | Discharge: 2023-10-29 | Disposition: A | Discharge status: Home / Self Care | Source: Ambulatory Visit | Attending: Neurology | Admitting: Neurology

## 2023-10-29 ENCOUNTER — Encounter (HOSPITAL_COMMUNITY): Payer: Self-pay

## 2023-10-29 DIAGNOSIS — G9389 Other specified disorders of brain: Secondary | ICD-10-CM | POA: Diagnosis not present

## 2023-10-29 DIAGNOSIS — Z8673 Personal history of transient ischemic attack (TIA), and cerebral infarction without residual deficits: Secondary | ICD-10-CM | POA: Diagnosis present

## 2023-10-29 MED ORDER — GADOBUTROL 1 MMOL/ML IV SOLN
6.0000 mL | Freq: Once | INTRAVENOUS | Status: AC | PRN
  Administered 2023-10-29: 6 mL via INTRAVENOUS

## 2023-10-29 NOTE — Telephone Encounter (Signed)
 I resubmitted both of these and the brain MRI is approved but the lumbar MRI is still pending. Brain auth: [id number] exp. 10/27/2023-11/26/2023

## 2023-10-30 ENCOUNTER — Other Ambulatory Visit (HOSPITAL_COMMUNITY)
Admission: RE | Admit: 2023-10-30 | Discharge: 2023-10-30 | Disposition: A | Discharge status: Home / Self Care | Payer: Self-pay | Source: Ambulatory Visit | Attending: Medical Genetics | Admitting: Medical Genetics

## 2023-10-31 ENCOUNTER — Ambulatory Visit: Payer: Medicaid Other

## 2023-11-04 NOTE — Telephone Encounter (Signed)
 MRI lumbar spine approved. [facility]: [id number] exp. 10/27/2023-11/26/2023 sent to [facility] [phone]

## 2023-11-09 LAB — GENECONNECT MOLECULAR SCREEN: Genetic Analysis Overall Interpretation: NEGATIVE

## 2023-11-13 ENCOUNTER — Ambulatory Visit (HOSPITAL_COMMUNITY)
Admission: RE | Admit: 2023-11-13 | Discharge: 2023-11-13 | Disposition: A | Discharge status: Home / Self Care | Source: Ambulatory Visit | Attending: Neurology | Admitting: Neurology

## 2023-11-13 DIAGNOSIS — M5440 Lumbago with sciatica, unspecified side: Secondary | ICD-10-CM | POA: Insufficient documentation

## 2023-11-17 NOTE — Progress Notes (Signed)
 Remote Loop Recorder Transmission

## 2023-11-20 ENCOUNTER — Ambulatory Visit

## 2023-11-20 DIAGNOSIS — R55 Syncope and collapse: Secondary | ICD-10-CM | POA: Diagnosis not present

## 2023-11-20 LAB — CUP PACEART REMOTE DEVICE CHECK
Date Time Interrogation Session: 20251001231838
Implantable Pulse Generator Implant Date: 20230501

## 2023-11-24 ENCOUNTER — Ambulatory Visit: Payer: Self-pay | Admitting: Cardiology

## 2023-11-24 NOTE — Progress Notes (Signed)
 Remote Loop Recorder Transmission

## 2023-12-05 ENCOUNTER — Ambulatory Visit: Payer: Medicaid Other

## 2023-12-22 ENCOUNTER — Ambulatory Visit

## 2023-12-22 DIAGNOSIS — R55 Syncope and collapse: Secondary | ICD-10-CM | POA: Diagnosis not present

## 2023-12-22 LAB — CUP PACEART REMOTE DEVICE CHECK
Date Time Interrogation Session: 20251102231919
Implantable Pulse Generator Implant Date: 20230501

## 2023-12-23 ENCOUNTER — Ambulatory Visit: Payer: Self-pay | Admitting: Cardiology

## 2023-12-24 NOTE — Progress Notes (Signed)
 Remote Loop Recorder Transmission

## 2024-01-09 ENCOUNTER — Ambulatory Visit: Payer: Medicaid Other

## 2024-01-22 ENCOUNTER — Ambulatory Visit

## 2024-01-22 ENCOUNTER — Ambulatory Visit: Payer: Self-pay | Admitting: Cardiology

## 2024-01-22 LAB — CUP PACEART REMOTE DEVICE CHECK
Date Time Interrogation Session: 20251203232800
Implantable Pulse Generator Implant Date: 20230501

## 2024-01-28 NOTE — Progress Notes (Signed)
 Remote Loop Recorder Transmission

## 2024-02-03 ENCOUNTER — Other Ambulatory Visit: Payer: Self-pay

## 2024-02-03 ENCOUNTER — Ambulatory Visit

## 2024-02-03 VITALS — BP 119/74 | HR 70 | Wt 133.0 lb

## 2024-02-03 DIAGNOSIS — M797 Fibromyalgia: Secondary | ICD-10-CM | POA: Insufficient documentation

## 2024-02-03 DIAGNOSIS — O3680X0 Pregnancy with inconclusive fetal viability, not applicable or unspecified: Secondary | ICD-10-CM

## 2024-02-03 DIAGNOSIS — I6330 Cerebral infarction due to thrombosis of unspecified cerebral artery: Secondary | ICD-10-CM

## 2024-02-03 DIAGNOSIS — N96 Recurrent pregnancy loss: Secondary | ICD-10-CM | POA: Insufficient documentation

## 2024-02-03 DIAGNOSIS — O0990 Supervision of high risk pregnancy, unspecified, unspecified trimester: Secondary | ICD-10-CM

## 2024-02-03 MED ORDER — ONDANSETRON 4 MG PO TBDP: 4.0000 mg | ORAL_TABLET | Freq: Four times a day (QID) | ORAL | 1 refills | Status: AC | PRN

## 2024-02-03 MED ORDER — PROGESTERONE 200 MG PO CAPS: ORAL_CAPSULE | ORAL | 3 refills | Status: AC

## 2024-02-03 NOTE — Progress Notes (Signed)
 New OB Intake  I explained I am completing New OB Intake today. We discussed EDD of 09/15/2024, by Last Menstrual Period. Pt is [name]. I reviewed her allergies, medications and Medical/Surgical/OB history.    Patient Active Problem List   Diagnosis Date Noted   Supervision of high risk pregnancy, antepartum 02/03/2024   History of multiple spontaneous abortions 02/03/2024   Fibromyalgia 02/03/2024   PFO (patent foramen ovale) 05/29/2021   PSVT (paroxysmal supraventricular tachycardia) 05/29/2021   Syncope and collapse 05/29/2021   Cerebral thrombosis with cerebral infarction 12/12/2020   Concussion 12/09/2020   AC separation, right, initial encounter     Concerns addressed today  Patient informed that the ultrasound is considered a limited obstetric ultrasound and is not intended to be a complete ultrasound exam.  Patient also informed that the ultrasound is not being completed with the intent of assessing for fetal or placental anomalies or any pelvic abnormalities. Explained that the purpose of today's ultrasound is to assess for viability.  Patient acknowledges the purpose of the exam and the limitations of the study.     Delivery Plans Plans to deliver at [facility] Discussed the nature of our practice with multiple providers including residents and students. Due to the size of the practice, the delivering provider may not be the same as those providing prenatal care.   MyChart/Babyscripts MyChart access verified. I explained pt will have some visits in office and some virtually. Babyscripts app discussed and ordered.   Blood Pressure Cuff Blood pressure cuff discussed and given.Discussed to be used for virtual visits and or if needed BP checks weekly.  Anatomy US  Explained first scheduled US  will be around 19 weeks.   Last Pap Diagnosis  Date Value Ref Range Status  05/01/2021   Final   - Negative for intraepithelial lesion or malignancy (NILM)    First visit review I  reviewed new OB appt with patient. Explained pt will be seen by Dr Herchel at first visit. Discussed [place] genetic screening with patient will get panorama at [facility]. Routine prenatal labs to be collected at [facility].    [name], RN 02/03/2024  9:23 AM

## 2024-02-13 ENCOUNTER — Ambulatory Visit: Payer: Medicaid Other

## 2024-02-22 ENCOUNTER — Ambulatory Visit: Attending: Cardiology

## 2024-02-22 DIAGNOSIS — R55 Syncope and collapse: Secondary | ICD-10-CM

## 2024-02-23 LAB — CUP PACEART REMOTE DEVICE CHECK
Date Time Interrogation Session: 20260103231342
Implantable Pulse Generator Implant Date: 20230501

## 2024-02-24 ENCOUNTER — Ambulatory Visit: Payer: Self-pay | Admitting: Cardiology

## 2024-02-24 ENCOUNTER — Other Ambulatory Visit: Payer: Self-pay | Admitting: *Deleted

## 2024-02-24 MED ORDER — METRONIDAZOLE 500 MG PO TABS: 500.0000 mg | ORAL_TABLET | Freq: Two times a day (BID) | ORAL | 0 refills | Status: AC

## 2024-02-24 MED ORDER — TERCONAZOLE 0.8 % VA CREA: 1.0000 | TOPICAL_CREAM | Freq: Every day | VAGINAL | 0 refills | Status: DC

## 2024-02-26 NOTE — Progress Notes (Signed)
 Remote Loop Recorder Transmission

## 2024-03-02 ENCOUNTER — Ambulatory Visit: Admitting: Obstetrics & Gynecology

## 2024-03-02 ENCOUNTER — Encounter: Payer: Self-pay | Admitting: Obstetrics & Gynecology

## 2024-03-02 ENCOUNTER — Other Ambulatory Visit (HOSPITAL_COMMUNITY)
Admission: RE | Admit: 2024-03-02 | Discharge: 2024-03-02 | Disposition: A | Source: Ambulatory Visit | Attending: Obstetrics & Gynecology | Admitting: Obstetrics & Gynecology

## 2024-03-02 VITALS — BP 107/64 | HR 67 | Wt 135.0 lb

## 2024-03-02 DIAGNOSIS — N898 Other specified noninflammatory disorders of vagina: Secondary | ICD-10-CM

## 2024-03-02 DIAGNOSIS — O26891 Other specified pregnancy related conditions, first trimester: Secondary | ICD-10-CM | POA: Diagnosis not present

## 2024-03-02 DIAGNOSIS — O0990 Supervision of high risk pregnancy, unspecified, unspecified trimester: Secondary | ICD-10-CM | POA: Diagnosis present

## 2024-03-02 DIAGNOSIS — Z1329 Encounter for screening for other suspected endocrine disorder: Secondary | ICD-10-CM

## 2024-03-02 DIAGNOSIS — O0991 Supervision of high risk pregnancy, unspecified, first trimester: Secondary | ICD-10-CM | POA: Diagnosis not present

## 2024-03-02 DIAGNOSIS — Z3A11 11 weeks gestation of pregnancy: Secondary | ICD-10-CM | POA: Insufficient documentation

## 2024-03-02 DIAGNOSIS — Q2112 Patent foramen ovale: Secondary | ICD-10-CM

## 2024-03-02 DIAGNOSIS — Z8673 Personal history of transient ischemic attack (TIA), and cerebral infarction without residual deficits: Secondary | ICD-10-CM | POA: Insufficient documentation

## 2024-03-02 DIAGNOSIS — Z131 Encounter for screening for diabetes mellitus: Secondary | ICD-10-CM

## 2024-03-02 DIAGNOSIS — N96 Recurrent pregnancy loss: Secondary | ICD-10-CM | POA: Diagnosis not present

## 2024-03-02 DIAGNOSIS — I471 Supraventricular tachycardia, unspecified: Secondary | ICD-10-CM | POA: Diagnosis not present

## 2024-03-02 MED ORDER — ASPIRIN 81 MG PO TBEC: 162.0000 mg | DELAYED_RELEASE_TABLET | Freq: Every day | ORAL | 2 refills | Status: AC
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
## 2024-03-03 ENCOUNTER — Ambulatory Visit: Payer: Self-pay | Admitting: Obstetrics & Gynecology

## 2024-03-03 DIAGNOSIS — O23591 Infection of other part of genital tract in pregnancy, first trimester: Secondary | ICD-10-CM

## 2024-03-03 DIAGNOSIS — O0990 Supervision of high risk pregnancy, unspecified, unspecified trimester: Secondary | ICD-10-CM

## 2024-03-03 LAB — CBC/D/PLT+RPR+RH+ABO+RUBIGG...
Antibody Screen: NEGATIVE
Basophils Absolute: 0 x10E3/uL (ref 0.0–0.2)
Basos: 0 %
EOS (ABSOLUTE): 0.1 x10E3/uL (ref 0.0–0.4)
Eos: 1 %
HCV Ab: NONREACTIVE
HIV Screen 4th Generation wRfx: NONREACTIVE
Hematocrit: 36.3 % (ref 34.0–46.6)
Hemoglobin: 12.1 g/dL (ref 11.1–15.9)
Hepatitis B Surface Ag: NEGATIVE
Immature Grans (Abs): 0 x10E3/uL (ref 0.0–0.1)
Immature Granulocytes: 0 %
Lymphocytes Absolute: 2.5 x10E3/uL (ref 0.7–3.1)
Lymphs: 26 %
MCH: 30.6 pg (ref 26.6–33.0)
MCHC: 33.3 g/dL (ref 31.5–35.7)
MCV: 92 fL (ref 79–97)
Monocytes Absolute: 0.7 x10E3/uL (ref 0.1–0.9)
Monocytes: 7 %
Neutrophils Absolute: 6.6 x10E3/uL (ref 1.4–7.0)
Neutrophils: 66 %
Platelets: 269 x10E3/uL (ref 150–450)
RBC: 3.95 x10E6/uL (ref 3.77–5.28)
RDW: 12.8 % (ref 11.7–15.4)
RPR Ser Ql: NONREACTIVE
Rh Factor: POSITIVE
Rubella Antibodies, IGG: 2.95 {index}
WBC: 9.9 x10E3/uL (ref 3.4–10.8)

## 2024-03-03 LAB — HEMOGLOBIN A1C
Est. average glucose Bld gHb Est-mCnc: 103 mg/dL
Hgb A1c MFr Bld: 5.2 % (ref 4.8–5.6)

## 2024-03-03 LAB — COMPREHENSIVE METABOLIC PANEL WITH GFR
ALT: 12 IU/L (ref 0–32)
AST: 16 IU/L (ref 0–40)
Albumin: 4.2 g/dL (ref 4.0–5.0)
Alkaline Phosphatase: 62 IU/L (ref 41–116)
BUN/Creatinine Ratio: 14 (ref 9–23)
BUN: 10 mg/dL (ref 6–20)
Bilirubin Total: 0.3 mg/dL (ref 0.0–1.2)
CO2: 17 mmol/L — ABNORMAL LOW (ref 20–29)
Calcium: 9.3 mg/dL (ref 8.7–10.2)
Chloride: 100 mmol/L (ref 96–106)
Creatinine, Ser: 0.74 mg/dL (ref 0.57–1.00)
Globulin, Total: 2.7 g/dL (ref 1.5–4.5)
Glucose: 72 mg/dL (ref 70–99)
Potassium: 4 mmol/L (ref 3.5–5.2)
Sodium: 135 mmol/L (ref 134–144)
Total Protein: 6.9 g/dL (ref 6.0–8.5)
eGFR: 112 mL/min/1.73

## 2024-03-03 LAB — TSH RFX ON ABNORMAL TO FREE T4: TSH: 2.61 u[IU]/mL (ref 0.450–4.500)

## 2024-03-03 LAB — HCV INTERPRETATION

## 2024-03-04 LAB — CERVICOVAGINAL ANCILLARY ONLY
Bacterial Vaginitis (gardnerella): POSITIVE — AB
Candida Glabrata: NEGATIVE
Candida Vaginitis: POSITIVE — AB
Chlamydia: NEGATIVE
Comment: NEGATIVE
Comment: NEGATIVE
Comment: NEGATIVE
Comment: NEGATIVE
Comment: NEGATIVE
Comment: NORMAL
Neisseria Gonorrhea: NEGATIVE
Trichomonas: NEGATIVE

## 2024-03-04 LAB — URINE CULTURE, OB REFLEX

## 2024-03-04 LAB — CULTURE, OB URINE

## 2024-03-04 NOTE — Telephone Encounter (Signed)
"   I called the patient and left message on her answering machine that I received MRI scan of the lumbar spine suggesting a bulging disc.  The patient was in a lot of pain or her neurological symptoms I will consider urgent referral to a spine surgeon to discuss treatment options.  She was advised to call the office to discuss further plan of care. "

## 2024-03-05 LAB — CYTOLOGY - PAP
Comment: NEGATIVE
Diagnosis: NEGATIVE
High risk HPV: NEGATIVE

## 2024-03-09 LAB — PANORAMA PRENATAL TEST FULL PANEL:PANORAMA TEST PLUS 5 ADDITIONAL MICRODELETIONS: FETAL FRACTION: 11.1

## 2024-03-10 MED ORDER — TERCONAZOLE 0.4 % VA CREA: 1.0000 | TOPICAL_CREAM | Freq: Every day | VAGINAL | 0 refills | Status: AC

## 2024-03-10 MED ORDER — METRONIDAZOLE 500 MG PO TABS: 500.0000 mg | ORAL_TABLET | Freq: Two times a day (BID) | ORAL | 0 refills | Status: AC

## 2024-03-10 NOTE — Addendum Note (Signed)
 Addended by: HERCHEL GRUMET A on: 03/10/2024 01:11 PM   Modules accepted: Orders

## 2024-03-21 ENCOUNTER — Encounter: Payer: Self-pay | Admitting: Obstetrics & Gynecology

## 2024-03-24 ENCOUNTER — Ambulatory Visit

## 2024-03-24 LAB — CUP PACEART REMOTE DEVICE CHECK
Date Time Interrogation Session: 20260203231823
Implantable Pulse Generator Implant Date: 20230501

## 2024-03-26 ENCOUNTER — Ambulatory Visit: Payer: Self-pay | Admitting: Cardiology

## 2024-03-30 ENCOUNTER — Other Ambulatory Visit

## 2024-03-30 ENCOUNTER — Ambulatory Visit

## 2024-03-30 ENCOUNTER — Encounter: Admitting: Obstetrics & Gynecology

## 2024-04-02 ENCOUNTER — Ambulatory Visit: Admitting: Emergency Medicine

## 2024-04-21 ENCOUNTER — Ambulatory Visit: Admitting: Neurology

## 2024-04-24 ENCOUNTER — Ambulatory Visit

## 2024-04-27 ENCOUNTER — Ambulatory Visit

## 2024-04-27 ENCOUNTER — Other Ambulatory Visit

## 2024-05-05 ENCOUNTER — Ambulatory Visit: Admitting: Neurology

## 2024-05-25 ENCOUNTER — Ambulatory Visit
